# Patient Record
Sex: Male | Born: 1975 | Race: White | Hispanic: Yes | Marital: Married | Smoking: Never smoker
Health system: Southern US, Community
[De-identification: ages and names within clinical notes are randomized; demographics above are authoritative.]

## PROBLEM LIST (undated history)

## (undated) DIAGNOSIS — K219 Gastro-esophageal reflux disease without esophagitis: Secondary | ICD-10-CM

## (undated) DIAGNOSIS — I10 Essential (primary) hypertension: Secondary | ICD-10-CM

## (undated) DIAGNOSIS — G43909 Migraine, unspecified, not intractable, without status migrainosus: Secondary | ICD-10-CM

## (undated) HISTORY — PX: WISDOM TOOTH EXTRACTION: SHX21

## (undated) HISTORY — PX: OTHER SURGICAL HISTORY: SHX169

## (undated) HISTORY — PX: ROTATOR CUFF REPAIR: SHX139

## (undated) HISTORY — PX: APPENDECTOMY: SHX54

## (undated) HISTORY — PX: HERNIA REPAIR: SHX51

---

## 2005-02-07 ENCOUNTER — Emergency Department (HOSPITAL_COMMUNITY): Admission: EM | Admit: 2005-02-07 | Discharge: 2005-02-07 | Payer: Self-pay | Admitting: Emergency Medicine

## 2007-05-17 ENCOUNTER — Encounter: Admission: RE | Admit: 2007-05-17 | Discharge: 2007-05-17 | Payer: Self-pay | Admitting: Internal Medicine

## 2007-09-10 ENCOUNTER — Emergency Department (HOSPITAL_COMMUNITY): Admission: EM | Admit: 2007-09-10 | Discharge: 2007-09-10 | Payer: Self-pay | Admitting: Emergency Medicine

## 2009-01-11 ENCOUNTER — Emergency Department (HOSPITAL_COMMUNITY): Admission: EM | Admit: 2009-01-11 | Discharge: 2009-01-11 | Payer: Self-pay | Admitting: Emergency Medicine

## 2009-05-27 ENCOUNTER — Emergency Department (HOSPITAL_COMMUNITY): Admission: EM | Admit: 2009-05-27 | Discharge: 2009-05-27 | Payer: Self-pay | Admitting: Family Medicine

## 2009-08-24 ENCOUNTER — Emergency Department (HOSPITAL_COMMUNITY): Admission: EM | Admit: 2009-08-24 | Discharge: 2009-08-24 | Payer: Self-pay | Admitting: Emergency Medicine

## 2010-04-04 ENCOUNTER — Emergency Department (HOSPITAL_COMMUNITY): Admission: EM | Admit: 2010-04-04 | Discharge: 2010-04-04 | Payer: Self-pay | Admitting: Emergency Medicine

## 2010-10-12 ENCOUNTER — Ambulatory Visit (INDEPENDENT_AMBULATORY_CARE_PROVIDER_SITE_OTHER): Payer: 59

## 2010-10-12 ENCOUNTER — Inpatient Hospital Stay (INDEPENDENT_AMBULATORY_CARE_PROVIDER_SITE_OTHER)
Admission: RE | Admit: 2010-10-12 | Discharge: 2010-10-12 | Disposition: A | Discharge status: Home / Self Care | Payer: 59 | Source: Ambulatory Visit | Attending: Family Medicine | Admitting: Family Medicine

## 2010-10-12 DIAGNOSIS — M25519 Pain in unspecified shoulder: Secondary | ICD-10-CM

## 2010-11-14 LAB — DIFFERENTIAL
Basophils Absolute: 0 10*3/uL (ref 0.0–0.1)
Basophils Relative: 0 % (ref 0–1)
Eosinophils Absolute: 0.2 10*3/uL (ref 0.0–0.7)
Lymphs Abs: 2.1 10*3/uL (ref 0.7–4.0)
Monocytes Absolute: 1 10*3/uL (ref 0.1–1.0)
Monocytes Relative: 11 % (ref 3–12)

## 2010-11-14 LAB — CBC
HCT: 43.6 % (ref 39.0–52.0)
Hemoglobin: 15.6 g/dL (ref 13.0–17.0)
Platelets: 320 10*3/uL (ref 150–400)
RDW: 12 % (ref 11.5–15.5)
WBC: 9.8 10*3/uL (ref 4.0–10.5)

## 2010-11-14 LAB — HEMOCCULT GUIAC POC 1CARD (OFFICE): Fecal Occult Bld: POSITIVE

## 2012-08-01 ENCOUNTER — Emergency Department (HOSPITAL_COMMUNITY)
Admission: EM | Admit: 2012-08-01 | Discharge: 2012-08-01 | Disposition: A | Discharge status: Home / Self Care | Payer: Worker's Compensation | Attending: Emergency Medicine | Admitting: Emergency Medicine

## 2012-08-01 ENCOUNTER — Encounter (HOSPITAL_COMMUNITY): Payer: Self-pay | Admitting: Emergency Medicine

## 2012-08-01 ENCOUNTER — Emergency Department (HOSPITAL_COMMUNITY): Payer: Worker's Compensation

## 2012-08-01 DIAGNOSIS — M25519 Pain in unspecified shoulder: Secondary | ICD-10-CM

## 2012-08-01 DIAGNOSIS — S4980XA Other specified injuries of shoulder and upper arm, unspecified arm, initial encounter: Secondary | ICD-10-CM | POA: Insufficient documentation

## 2012-08-01 DIAGNOSIS — Y9389 Activity, other specified: Secondary | ICD-10-CM | POA: Insufficient documentation

## 2012-08-01 DIAGNOSIS — Y9289 Other specified places as the place of occurrence of the external cause: Secondary | ICD-10-CM | POA: Insufficient documentation

## 2012-08-01 DIAGNOSIS — S46909A Unspecified injury of unspecified muscle, fascia and tendon at shoulder and upper arm level, unspecified arm, initial encounter: Secondary | ICD-10-CM | POA: Insufficient documentation

## 2012-08-01 DIAGNOSIS — S4992XA Unspecified injury of left shoulder and upper arm, initial encounter: Secondary | ICD-10-CM

## 2012-08-01 DIAGNOSIS — I10 Essential (primary) hypertension: Secondary | ICD-10-CM | POA: Insufficient documentation

## 2012-08-01 DIAGNOSIS — W52XXXA Crushed, pushed or stepped on by crowd or human stampede, initial encounter: Secondary | ICD-10-CM | POA: Insufficient documentation

## 2012-08-01 DIAGNOSIS — Z79899 Other long term (current) drug therapy: Secondary | ICD-10-CM | POA: Insufficient documentation

## 2012-08-01 HISTORY — DX: Essential (primary) hypertension: I10

## 2012-08-01 MED ORDER — MELOXICAM 15 MG PO TABS: ORAL_TABLET | ORAL | Status: DC

## 2012-08-01 MED ORDER — MELOXICAM 7.5 MG PO TABS: 15.0000 mg | ORAL_TABLET | Freq: Every day | ORAL | Status: DC
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
## 2012-08-04 NOTE — ED Provider Notes (Signed)
Medical screening examination/treatment/procedure(s) were performed by non-physician practitioner and as supervising physician I was immediately available for consultation/collaboration.  Raeford Razor, MD 08/04/12 [phone]
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
## 2013-12-15 ENCOUNTER — Ambulatory Visit
Admission: RE | Admit: 2013-12-15 | Discharge: 2013-12-15 | Disposition: A | Discharge status: Home / Self Care | Payer: No Typology Code available for payment source | Source: Ambulatory Visit | Attending: Occupational Medicine | Admitting: Occupational Medicine

## 2013-12-15 ENCOUNTER — Other Ambulatory Visit: Payer: Self-pay | Admitting: Occupational Medicine

## 2013-12-15 DIAGNOSIS — Z0000 Encounter for general adult medical examination without abnormal findings: Secondary | ICD-10-CM

## 2014-03-20 ENCOUNTER — Ambulatory Visit: Payer: Self-pay | Admitting: Cardiovascular Disease

## 2014-07-03 ENCOUNTER — Emergency Department (HOSPITAL_COMMUNITY)
Admission: EM | Admit: 2014-07-03 | Discharge: 2014-07-03 | Disposition: A | Discharge status: Home / Self Care | Payer: 59 | Source: Home / Self Care | Attending: Emergency Medicine | Admitting: Emergency Medicine

## 2014-07-03 ENCOUNTER — Encounter (HOSPITAL_COMMUNITY): Payer: Self-pay | Admitting: Emergency Medicine

## 2014-07-03 DIAGNOSIS — J9801 Acute bronchospasm: Secondary | ICD-10-CM

## 2014-07-03 DIAGNOSIS — J069 Acute upper respiratory infection, unspecified: Secondary | ICD-10-CM

## 2014-07-03 MED ORDER — BENZONATATE 100 MG PO CAPS: 100.0000 mg | ORAL_CAPSULE | Freq: Three times a day (TID) | ORAL | Status: DC

## 2014-07-03 MED ORDER — ALBUTEROL SULFATE HFA 108 (90 BASE) MCG/ACT IN AERS
1.0000 | INHALATION_SPRAY | Freq: Four times a day (QID) | RESPIRATORY_TRACT | Status: DC | PRN
Start: 2014-07-03 — End: 2020-02-14

## 2014-07-03 MED ORDER — ALBUTEROL SULFATE (5 MG/ML) 0.5% IN NEBU
5.0000 mg | INHALATION_SOLUTION | Freq: Once | RESPIRATORY_TRACT | Status: AC
  Administered 2014-07-03: 5 mg via RESPIRATORY_TRACT

## 2014-07-03 MED ORDER — IPRATROPIUM BROMIDE 0.02 % IN SOLN
0.5000 mg | Freq: Once | RESPIRATORY_TRACT | Status: AC
  Administered 2014-07-03: 0.5 mg via RESPIRATORY_TRACT

## 2014-07-03 MED ORDER — PREDNISONE 20 MG PO TABS
ORAL_TABLET | ORAL | Status: AC
  Filled 2014-07-03: qty 3

## 2014-07-03 MED ORDER — IPRATROPIUM BROMIDE 0.02 % IN SOLN
RESPIRATORY_TRACT | Status: AC
  Filled 2014-07-03: qty 2.5

## 2014-07-03 MED ORDER — ALBUTEROL SULFATE (2.5 MG/3ML) 0.083% IN NEBU
INHALATION_SOLUTION | RESPIRATORY_TRACT | Status: AC
  Filled 2014-07-03: qty 3

## 2014-07-03 MED ORDER — IPRATROPIUM BROMIDE 0.06 % NA SOLN: 2.0000 | Freq: Four times a day (QID) | NASAL | Status: DC

## 2014-07-03 MED ORDER — PREDNISONE 20 MG PO TABS
60.0000 mg | ORAL_TABLET | Freq: Once | ORAL | Status: AC
  Administered 2014-07-03: 60 mg via ORAL

## 2014-07-03 NOTE — Discharge Instructions (Signed)
Bronchospasm °A bronchospasm is a spasm or tightening of the airways going into the lungs. During a bronchospasm breathing becomes more difficult because the airways get smaller. When this happens there can be coughing, a whistling sound when breathing (wheezing), and difficulty breathing. Bronchospasm is often associated with asthma, but not all patients who experience a bronchospasm have asthma. °CAUSES  °A bronchospasm is caused by inflammation or irritation of the airways. The inflammation or irritation may be triggered by:  °· Allergies (such as to animals, pollen, food, or mold). Allergens that cause bronchospasm may cause wheezing immediately after exposure or many hours later.   °· Infection. Viral infections are believed to be the most common cause of bronchospasm.   °· Exercise.   °· Irritants (such as pollution, cigarette smoke, strong odors, aerosol sprays, and paint fumes).   °· Weather changes. Winds increase molds and pollens in the air. Rain refreshes the air by washing irritants out. Cold air may cause inflammation.   °· Stress and emotional upset.   °SIGNS AND SYMPTOMS  °· Wheezing.   °· Excessive nighttime coughing.   °· Frequent or severe coughing with a simple cold.   °· Chest tightness.   °· Shortness of breath.   °DIAGNOSIS  °Bronchospasm is usually diagnosed through a history and physical exam. Tests, such as chest X-rays, are sometimes done to look for other conditions. °TREATMENT  °· Inhaled medicines can be given to open up your airways and help you breathe. The medicines can be given using either an inhaler or a nebulizer machine. °· Corticosteroid medicines may be given for severe bronchospasm, usually when it is associated with asthma. °HOME CARE INSTRUCTIONS  °· Always have a plan prepared for seeking medical care. Know when to call your health care provider and local emergency services (911 in the [place] Know where you can access local emergency care. °· Only take medicines as  directed by your health care provider. °· If you were prescribed an inhaler or nebulizer machine, ask your health care provider to explain how to use it correctly. Always use a spacer with your inhaler if you were given one. °· It is necessary to remain calm during an attack. Try to relax and breathe more slowly.  °· Control your home environment in the following ways:   °· Change your heating and air conditioning filter at least once a month.   °· Limit your use of fireplaces and wood stoves. °· Do not smoke and do not allow smoking in your home.   °· Avoid exposure to perfumes and fragrances.   °· Get rid of pests (such as roaches and mice) and their droppings.   °· Throw away plants if you see mold on them.   °· Keep your house clean and dust free.   °· Replace carpet with wood, tile, or vinyl flooring. Carpet can trap dander and dust.   °· Use allergy-proof pillows, mattress covers, and box spring covers.   °· Wash bed sheets and blankets every week in hot water and dry them in a dryer.   °· Use blankets that are made of polyester or cotton.   °· Wash hands frequently. °SEEK MEDICAL CARE IF:  °· You have muscle aches.   °· You have chest pain.   °· The sputum changes from clear or white to yellow, green, gray, or bloody.   °· The sputum you cough up gets thicker.   °· There are problems that may be related to the medicine you are given, such as a rash, itching, swelling, or trouble breathing.   °SEEK IMMEDIATE MEDICAL CARE IF:  °· You have worsening wheezing and coughing even   after taking your prescribed medicines.   °· You have increased difficulty breathing.   °· You develop severe chest pain. °MAKE SURE YOU:  °· Understand these instructions. °· Will watch your condition. °· Will get help right away if you are not doing well or get worse. °Document Released: 07/31/2003 Document Revised: 08/02/2013 Document Reviewed: 01/17/2013 °ExitCare® Patient Information ©2015 [employer]. This information is not  intended to replace advice given to you by your health care provider. Make sure you discuss any questions you have with your health care provider. ° °Upper Respiratory Infection, Adult °An upper respiratory infection (URI) is also sometimes known as the common cold. The upper respiratory tract includes the nose, sinuses, throat, trachea, and bronchi. Bronchi are the airways leading to the lungs. Most people improve within 1 week, but symptoms can last up to 2 weeks. A residual cough may last even longer.  °CAUSES °Many different viruses can infect the tissues lining the upper respiratory tract. The tissues become irritated and inflamed and often become very moist. Mucus production is also common. A cold is contagious. You can easily spread the virus to others by oral contact. This includes kissing, sharing a glass, coughing, or sneezing. Touching your mouth or nose and then touching a surface, which is then touched by another person, can also spread the virus. °SYMPTOMS  °Symptoms typically develop 1 to 3 days after you come in contact with a cold virus. Symptoms vary from person to person. They may include: °· Runny nose. °· Sneezing. °· Nasal congestion. °· Sinus irritation. °· Sore throat. °· Loss of voice (laryngitis). °· Cough. °· Fatigue. °· Muscle aches. °· Loss of appetite. °· Headache. °· Low-grade fever. °DIAGNOSIS  °You might diagnose your own cold based on familiar symptoms, since most people get a cold 2 to 3 times a year. Your caregiver can confirm this based on your exam. Most importantly, your caregiver can check that your symptoms are not due to another disease such as strep throat, sinusitis, pneumonia, asthma, or epiglottitis. Blood tests, throat tests, and X-rays are not necessary to diagnose a common cold, but they may sometimes be helpful in excluding other more serious diseases. Your caregiver will decide if any further tests are required. °RISKS AND COMPLICATIONS  °You may be at risk for a  more severe case of the common cold if you smoke cigarettes, have chronic heart disease (such as heart failure) or lung disease (such as asthma), or if you have a weakened immune system. The very young and very old are also at risk for more serious infections. Bacterial sinusitis, middle ear infections, and bacterial pneumonia can complicate the common cold. The common cold can worsen asthma and chronic obstructive pulmonary disease (COPD). Sometimes, these complications can require emergency medical care and may be life-threatening. °PREVENTION  °The best way to protect against getting a cold is to practice good hygiene. Avoid oral or hand contact with people with cold symptoms. Wash your hands often if contact occurs. There is no clear evidence that vitamin C, vitamin E, echinacea, or exercise reduces the chance of developing a cold. However, it is always recommended to get plenty of rest and practice good nutrition. °TREATMENT  °Treatment is directed at relieving symptoms. There is no cure. Antibiotics are not effective, because the infection is caused by a virus, not by bacteria. Treatment may include: °· Increased fluid intake. Sports drinks offer valuable electrolytes, sugars, and fluids. °· Breathing heated mist or steam (vaporizer or shower). °· Eating chicken soup   or other clear broths, and maintaining good nutrition. °· Getting plenty of rest. °· Using gargles or lozenges for comfort. °· Controlling fevers with ibuprofen or acetaminophen as directed by your caregiver. °· Increasing usage of your inhaler if you have asthma. °Zinc gel and zinc lozenges, taken in the first 24 hours of the common cold, can shorten the duration and lessen the severity of symptoms. Pain medicines may help with fever, muscle aches, and throat pain. A variety of non-prescription medicines are available to treat congestion and runny nose. Your caregiver can make recommendations and may suggest nasal or lung inhalers for other  symptoms.  °HOME CARE INSTRUCTIONS  °· Only take over-the-counter or prescription medicines for pain, discomfort, or fever as directed by your caregiver. °· Use a warm mist humidifier or inhale steam from a shower to increase air moisture. This may keep secretions moist and make it easier to breathe. °· Drink enough water and fluids to keep your urine clear or pale yellow. °· Rest as needed. °· Return to work when your temperature has returned to normal or as your caregiver advises. You may need to stay home longer to avoid infecting others. You can also use a face mask and careful hand washing to prevent spread of the virus. °SEEK MEDICAL CARE IF:  °· After the first few days, you feel you are getting worse rather than better. °· You need your caregiver's advice about medicines to control symptoms. °· You develop chills, worsening shortness of breath, or brown or red sputum. These may be signs of pneumonia. °· You develop yellow or brown nasal discharge or pain in the face, especially when you bend forward. These may be signs of sinusitis. °· You develop a fever, swollen neck glands, pain with swallowing, or white areas in the back of your throat. These may be signs of strep throat. °SEEK IMMEDIATE MEDICAL CARE IF:  °· You have a fever. °· You develop severe or persistent headache, ear pain, sinus pain, or chest pain. °· You develop wheezing, a prolonged cough, cough up blood, or have a change in your usual mucus (if you have chronic lung disease). °· You develop sore muscles or a stiff neck. °Document Released: 01/21/2001 Document Revised: 10/20/2011 Document Reviewed: 11/02/2013 °ExitCare® Patient Information ©2015 [employer]. This information is not intended to replace advice given to you by your health care provider. Make sure you discuss any questions you have with your health care provider. ° °

## 2014-07-03 NOTE — ED Provider Notes (Signed)
CSN: [id number]     Arrival date & time 07/03/14  0808 History   First MD Initiated Contact with Patient 07/03/14 [phone]     Chief Complaint  Patient presents with  . Cough  . Wheezing  . Shortness of Breath   (Consider location/radiation/quality/duration/timing/severity/associated sxs/prior Treatment) HPI Comments: Non-smoker Works as Emergency planning/management officerpolice officer  Patient is a 38 y.o. male presenting with URI.  URI Presenting symptoms: congestion, cough and rhinorrhea   Severity:  Moderate Onset quality:  Gradual Duration:  24 hours Timing:  Constant Progression:  Worsening Chronicity:  New Associated symptoms: wheezing   Associated symptoms: no arthralgias, no headaches, no myalgias, no neck pain, no sinus pain, no sneezing and no swollen glands     Past Medical History  Diagnosis Date  . Hypertension    Past Surgical History  Procedure Laterality Date  . Rotator cuff repair      Left  . Appendectomy    . Wisdom tooth extraction    . Dental implant     History reviewed. No pertinent family history. History  Substance Use Topics  . Smoking status: Never Smoker   . Smokeless tobacco: Not on file  . Alcohol Use: No    Review of Systems  HENT: Positive for congestion and rhinorrhea. Negative for sneezing.   Respiratory: Positive for cough and wheezing.   Musculoskeletal: Negative for myalgias, arthralgias and neck pain.  Neurological: Negative for headaches.  All other systems reviewed and are negative.   Allergies  Review of patient's allergies indicates no known allergies.  Home Medications   Prior to Admission medications   Medication Sig Start Date End Date Taking? Authorizing Provider  albuterol (PROVENTIL HFA;VENTOLIN HFA) 108 (90 BASE) MCG/ACT inhaler Inhale 1-2 puffs into the lungs every 6 (six) hours as needed for wheezing or shortness of breath. 07/03/14   Mathis FareJennifer Lee H Laquinton Bihm, PA  benzonatate (TESSALON) 100 MG capsule Take 1 capsule (100 mg total) by mouth  every 8 (eight) hours. 07/03/14   Mathis FareJennifer Lee H Janicia Monterrosa, PA  ipratropium (ATROVENT) 0.06 % nasal spray Place 2 sprays into both nostrils 4 (four) times daily. For nasal congestion 07/03/14   Ria ClockJennifer Lee H Elener Custodio, PA  meloxicam (MOBIC) 15 MG tablet Take one by mouth daily with a full meal. 08/01/12   Arthor CaptainAbigail Harris, PA-C  niacin (NIASPAN) 500 MG CR tablet Take 500 mg by mouth at bedtime.    Historical Provider, MD  verapamil (CALAN-SR) 120 MG CR tablet Take 120 mg by mouth at bedtime.    Historical Provider, MD   BP 125/98 mmHg  Pulse 80  Temp(Src) 98.7 F (37.1 C) (Oral)  Resp 16  SpO2 99% Physical Exam  Constitutional: He is oriented to person, place, and time. He appears well-developed and well-nourished. No distress.  HENT:  Head: Normocephalic and atraumatic.  Right Ear: Hearing, tympanic membrane, external ear and ear canal normal.  Left Ear: Hearing, external ear and ear canal normal. Tympanic membrane is injected.  Nose: Nose normal.  Mouth/Throat: Uvula is midline, oropharynx is clear and moist and mucous membranes are normal.  Eyes: Conjunctivae are normal. No scleral icterus.  Neck: Normal range of motion. Neck supple.  Cardiovascular: Normal rate, regular rhythm and normal heart sounds.   Pulmonary/Chest: Effort normal. No respiratory distress. He has wheezes. He has no rales. He exhibits no tenderness.  Musculoskeletal: Normal range of motion.  Lymphadenopathy:    He has no cervical adenopathy.  Neurological: He is alert and oriented to person,  place, and time.  Skin: Skin is warm and dry.  Psychiatric: He has a normal mood and affect. His behavior is normal.  Nursing note and vitals reviewed.   ED Course  Procedures (including critical care time) Labs Review Labs Reviewed - No data to display  Imaging Review No results found.   MDM   1. URI (upper respiratory infection)   2. Bronchospasm     Patient given Albuterol/Atrovent 5mg /0.5mg  neb treatment and  [facility]  [facility] reports significant improvement following neb tx Will D/C home on Atrovent nasal spray, tessalon, albuterol MDI and advise follow up if no improvement.   Mathis FareJennifer Lee H CombinePresson, [place] 07/03/14 [phone]

## 2014-07-03 NOTE — ED Notes (Signed)
Pt states that he has had shortness of breath with productive cough and some wheezing since last night it started at work. Pt is in no acute distress at this time

## 2017-01-20 ENCOUNTER — Other Ambulatory Visit: Payer: Self-pay | Admitting: Orthopedic Surgery

## 2017-01-20 DIAGNOSIS — R52 Pain, unspecified: Secondary | ICD-10-CM

## 2017-02-05 ENCOUNTER — Ambulatory Visit
Admission: RE | Admit: 2017-02-05 | Discharge: 2017-02-05 | Disposition: A | Discharge status: Home / Self Care | Payer: 59 | Source: Ambulatory Visit | Attending: Orthopedic Surgery | Admitting: Orthopedic Surgery

## 2017-02-05 DIAGNOSIS — R52 Pain, unspecified: Secondary | ICD-10-CM

## 2018-08-05 ENCOUNTER — Emergency Department (HOSPITAL_COMMUNITY): Payer: 59

## 2018-08-05 ENCOUNTER — Other Ambulatory Visit: Payer: Self-pay

## 2018-08-05 ENCOUNTER — Emergency Department (HOSPITAL_COMMUNITY)
Admission: EM | Admit: 2018-08-05 | Discharge: 2018-08-05 | Disposition: A | Discharge status: Home / Self Care | Payer: 59 | Attending: Emergency Medicine | Admitting: Emergency Medicine

## 2018-08-05 DIAGNOSIS — S46311A Strain of muscle, fascia and tendon of triceps, right arm, initial encounter: Secondary | ICD-10-CM | POA: Insufficient documentation

## 2018-08-05 DIAGNOSIS — Y999 Unspecified external cause status: Secondary | ICD-10-CM | POA: Diagnosis not present

## 2018-08-05 DIAGNOSIS — Z79899 Other long term (current) drug therapy: Secondary | ICD-10-CM | POA: Insufficient documentation

## 2018-08-05 DIAGNOSIS — I10 Essential (primary) hypertension: Secondary | ICD-10-CM | POA: Diagnosis not present

## 2018-08-05 DIAGNOSIS — S40021A Contusion of right upper arm, initial encounter: Secondary | ICD-10-CM | POA: Diagnosis not present

## 2018-08-05 DIAGNOSIS — Y9389 Activity, other specified: Secondary | ICD-10-CM | POA: Diagnosis not present

## 2018-08-05 DIAGNOSIS — M25521 Pain in right elbow: Secondary | ICD-10-CM | POA: Diagnosis not present

## 2018-08-05 DIAGNOSIS — Z23 Encounter for immunization: Secondary | ICD-10-CM | POA: Insufficient documentation

## 2018-08-05 DIAGNOSIS — W19XXXA Unspecified fall, initial encounter: Secondary | ICD-10-CM

## 2018-08-05 DIAGNOSIS — S4991XA Unspecified injury of right shoulder and upper arm, initial encounter: Secondary | ICD-10-CM | POA: Diagnosis present

## 2018-08-05 DIAGNOSIS — Y929 Unspecified place or not applicable: Secondary | ICD-10-CM | POA: Insufficient documentation

## 2018-08-05 DIAGNOSIS — S46319A Strain of muscle, fascia and tendon of triceps, unspecified arm, initial encounter: Secondary | ICD-10-CM

## 2018-08-05 MED ORDER — BACITRACIN ZINC 500 UNIT/GM EX OINT
TOPICAL_OINTMENT | Freq: Once | CUTANEOUS | Status: DC
  Filled 2018-08-05: qty 0.9

## 2018-08-05 MED ORDER — TETANUS-DIPHTH-ACELL PERTUSSIS 5-2.5-18.5 LF-MCG/0.5 IM SUSP
0.5000 mL | Freq: Once | INTRAMUSCULAR | Status: AC
  Administered 2018-08-05: 0.5 mL via INTRAMUSCULAR
  Filled 2018-08-05: qty 0.5

## 2018-08-05 MED ORDER — IBUPROFEN 200 MG PO TABS
600.0000 mg | ORAL_TABLET | Freq: Once | ORAL | Status: AC
  Administered 2018-08-05: 600 mg via ORAL
  Filled 2018-08-05: qty 3

## 2018-08-05 MED ORDER — NAPROXEN 500 MG PO TABS: 500.0000 mg | ORAL_TABLET | Freq: Two times a day (BID) | ORAL | 0 refills | Status: DC

## 2018-08-05 NOTE — ED Provider Notes (Signed)
[facility]-EMERGENCY DEPT Provider Note   CSN: [id number] Arrival date & time: 08/05/18  1449     History   Chief Complaint Chief Complaint  Patient presents with  . Shoulder Pain    HPI [name] is a 42 y.o. male who presents to the ED with right elbow and upper arm pain. Patient reports he was helping his daughter learn to ride a scooter and they both fell. He tried to cushion her fall and he hit the ground hard on his right arm. Patient denies head or neck injury or LOC.   HPI  Past Medical History:  Diagnosis Date  . Hypertension     There are no active problems to display for this patient.   Past Surgical History:  Procedure Laterality Date  . APPENDECTOMY    . dental implant    . ROTATOR CUFF REPAIR     Left  . WISDOM TOOTH EXTRACTION          Home Medications    Prior to Admission medications   Medication Sig Start Date End Date Taking? Authorizing Provider  albuterol (PROVENTIL HFA;VENTOLIN HFA) 108 (90 BASE) MCG/ACT inhaler Inhale 1-2 puffs into the lungs every 6 (six) hours as needed for wheezing or shortness of breath. 07/03/14   Presson, Mathis FareJennifer Lee H, PA  benzonatate (TESSALON) 100 MG capsule Take 1 capsule (100 mg total) by mouth every 8 (eight) hours. 07/03/14   Presson, Jess BartersJennifer Lee H, PA  ipratropium (ATROVENT) 0.06 % nasal spray Place 2 sprays into both nostrils 4 (four) times daily. For nasal congestion 07/03/14   Presson, Mathis FareJennifer Lee H, PA  meloxicam (MOBIC) 15 MG tablet Take one by mouth daily with a full meal. 08/01/12   Arthor CaptainHarris, Abigail, PA-C  naproxen (NAPROSYN) 500 MG tablet Take 1 tablet (500 mg total) by mouth 2 (two) times daily. 08/05/18   Janne NapoleonNeese, Tyrus Wilms M, NP  niacin (NIASPAN) 500 MG CR tablet Take 500 mg by mouth at bedtime.    [provider]  verapamil (CALAN-SR) 120 MG CR tablet Take 120 mg by mouth at bedtime.    [provider]    Family History No family history on  file.  Social History Social History   Tobacco Use  . Smoking status: Never Smoker  Substance Use Topics  . Alcohol use: No  . Drug use: No     Allergies   Patient has no known allergies.   Review of Systems Review of Systems  Musculoskeletal: Positive for arthralgias.  Skin: Positive for wound.  All other systems reviewed and are negative.    Physical Exam Updated Vital Signs BP (!) 148/108   Pulse 79   Temp 98.7 F (37.1 C) (Oral)   Resp 16   Ht 5' 9.5" (1.765 m)   Wt 103.4 kg   SpO2 98%   BMI 33.19 kg/m   Physical Exam Vitals signs and nursing note reviewed.  Constitutional:      General: He is not in acute distress.    Appearance: He is well-developed.  HENT:     Head: Normocephalic.     Nose: Nose normal.     Mouth/Throat:     Mouth: Mucous membranes are moist.  Eyes:     Extraocular Movements: Extraocular movements intact.     Conjunctiva/sclera: Conjunctivae normal.  Neck:     Musculoskeletal: Normal range of motion and neck supple. No muscular tenderness.  Cardiovascular:     Rate and Rhythm: Normal rate.  Pulmonary:     Effort: Pulmonary effort is normal.  Abdominal:     Palpations: Abdomen is soft.     Tenderness: There is no abdominal tenderness.  Musculoskeletal:     Right elbow: He exhibits swelling. Tenderness found. Olecranon process tenderness noted.     Right upper arm: He exhibits tenderness and swelling.     Comments: Radial pulse 2+, adequate circulation. Tender with palpation and range of motion of the elbow and triceps. Patient able to straighten his arms and has equal strength.  Patient denies shoulder pain and has full rang of motion without pain.   Skin:    General: Skin is warm and dry.  Neurological:     Mental Status: He is alert and oriented to person, place, and time.  Psychiatric:        Mood and Affect: Mood normal.      ED Treatments / Results  Labs (all labs ordered are listed, but only abnormal results are  displayed) Labs Reviewed - No data to display  Radiology Dg Shoulder Right  Result Date: 08/05/2018 CLINICAL DATA:  Larey Seat today.  Right shoulder pain. EXAM: RIGHT SHOULDER - 2+ VIEW COMPARISON:  None. FINDINGS: The joint spaces are maintained. No acute fracture is identified. The visualized right ribs are intact and the visualized right lung is clear. IMPRESSION: No fracture or dislocation. Electronically Signed   By: Rudie Meyer M.D.   On: 08/05/2018 15:38   Dg Elbow Complete Right  Result Date: 08/05/2018 CLINICAL DATA:  Larey Seat and injured right elbow today. EXAM: RIGHT ELBOW - COMPLETE 3+ VIEW COMPARISON:  None. FINDINGS: The joint spaces are maintained. No acute fracture. No degenerative changes. No osteochondral lesion. No joint effusion. IMPRESSION: No acute fracture or joint effusion. Electronically Signed   By: Rudie Meyer M.D.   On: 08/05/2018 15:36   Dg Humerus Right  Result Date: 08/05/2018 CLINICAL DATA:  Larey Seat today.  Right arm pain. EXAM: RIGHT HUMERUS - 2+ VIEW COMPARISON:  None. FINDINGS: The shoulder and elbow joints are maintained. No acute fracture of the humerus is identified. IMPRESSION: No acute bony findings. Electronically Signed   By: Rudie Meyer M.D.   On: 08/05/2018 15:36    Procedures Procedures (including critical care time)  Medications Ordered in ED Medications  bacitracin ointment (has no administration in time range)  Tdap (BOOSTRIX) injection 0.5 mL (has no administration in time range)  ibuprofen (ADVIL,MOTRIN) tablet 600 mg (600 mg Oral Given 08/05/18 1639)     Initial Impression / Assessment and Plan / ED Course  I have reviewed the triage vital signs and the nursing notes. 42 y.o. male here with right arm pain s/p fall from a scooter stable for d/c without fracture or dislocation note on x-ray and no focal neuro deficits. Patient reports he has a sling at home from his rotator cuff surgery that he can use as needed. If his pain in the right  elbow and triceps area continues he will f/u with his orthopedic surgeon. Return precautions discussed.   Final Clinical Impressions(s) / ED Diagnoses   Final diagnoses:  Arm contusion, right, initial encounter  Triceps strain, initial encounter  Fall, initial encounter    ED Discharge Orders         Ordered    naproxen (NAPROSYN) 500 MG tablet  2 times daily     08/05/18 1732           Kerrie Buffalo [place], NP 08/05/18 1737    [place],  Raynelle FanningJulie, MD 08/05/18 2242

## 2018-08-05 NOTE — Discharge Instructions (Signed)
Wear the sling you have at home as needed and take the medication for pain and inflammation. If the pain in your arm continues follow up with your orthopedic doctor for further evaluation. Return here as needed.

## 2018-08-05 NOTE — ED Triage Notes (Signed)
Pt reports falling off daughters scooter 2p today.  Reports sharp right triceps and elbow area.

## 2018-11-25 ENCOUNTER — Observation Stay (HOSPITAL_COMMUNITY)
Admission: EM | Admit: 2018-11-25 | Discharge: 2018-11-26 | Disposition: A | Discharge status: Home / Self Care | Payer: 59 | Attending: Internal Medicine | Admitting: Internal Medicine

## 2018-11-25 ENCOUNTER — Encounter (HOSPITAL_COMMUNITY): Payer: Self-pay

## 2018-11-25 ENCOUNTER — Emergency Department (HOSPITAL_COMMUNITY): Payer: 59

## 2018-11-25 ENCOUNTER — Other Ambulatory Visit: Payer: Self-pay

## 2018-11-25 DIAGNOSIS — K219 Gastro-esophageal reflux disease without esophagitis: Secondary | ICD-10-CM | POA: Insufficient documentation

## 2018-11-25 DIAGNOSIS — D72825 Bandemia: Secondary | ICD-10-CM | POA: Diagnosis not present

## 2018-11-25 DIAGNOSIS — J9601 Acute respiratory failure with hypoxia: Secondary | ICD-10-CM | POA: Insufficient documentation

## 2018-11-25 DIAGNOSIS — G43909 Migraine, unspecified, not intractable, without status migrainosus: Secondary | ICD-10-CM | POA: Insufficient documentation

## 2018-11-25 DIAGNOSIS — Z791 Long term (current) use of non-steroidal anti-inflammatories (NSAID): Secondary | ICD-10-CM | POA: Insufficient documentation

## 2018-11-25 DIAGNOSIS — I10 Essential (primary) hypertension: Secondary | ICD-10-CM

## 2018-11-25 DIAGNOSIS — D72829 Elevated white blood cell count, unspecified: Secondary | ICD-10-CM | POA: Diagnosis not present

## 2018-11-25 DIAGNOSIS — R6889 Other general symptoms and signs: Secondary | ICD-10-CM | POA: Diagnosis present

## 2018-11-25 DIAGNOSIS — R0902 Hypoxemia: Secondary | ICD-10-CM | POA: Diagnosis present

## 2018-11-25 DIAGNOSIS — Z20828 Contact with and (suspected) exposure to other viral communicable diseases: Secondary | ICD-10-CM | POA: Diagnosis not present

## 2018-11-25 DIAGNOSIS — J9600 Acute respiratory failure, unspecified whether with hypoxia or hypercapnia: Secondary | ICD-10-CM | POA: Diagnosis present

## 2018-11-25 DIAGNOSIS — Z79899 Other long term (current) drug therapy: Secondary | ICD-10-CM | POA: Insufficient documentation

## 2018-11-25 DIAGNOSIS — Z20822 Contact with and (suspected) exposure to covid-19: Secondary | ICD-10-CM

## 2018-11-25 DIAGNOSIS — R05 Cough: Principal | ICD-10-CM | POA: Insufficient documentation

## 2018-11-25 DIAGNOSIS — R059 Cough, unspecified: Secondary | ICD-10-CM

## 2018-11-25 LAB — CBC WITH DIFFERENTIAL/PLATELET
Abs Immature Granulocytes: 0.06 10*3/uL (ref 0.00–0.07)
Basophils Absolute: 0.1 10*3/uL (ref 0.0–0.1)
Basophils Relative: 1 %
Eosinophils Absolute: 0.1 10*3/uL (ref 0.0–0.5)
Eosinophils Relative: 1 %
HCT: 47.5 % (ref 39.0–52.0)
Hemoglobin: 16.4 g/dL (ref 13.0–17.0)
Immature Granulocytes: 1 %
Lymphocytes Relative: 8 %
Lymphs Abs: 0.9 10*3/uL (ref 0.7–4.0)
MCH: 31.8 pg (ref 26.0–34.0)
MCHC: 34.5 g/dL (ref 30.0–36.0)
MCV: 92.1 fL (ref 80.0–100.0)
Monocytes Absolute: 0.7 10*3/uL (ref 0.1–1.0)
Monocytes Relative: 7 %
Neutro Abs: 9.2 10*3/uL — ABNORMAL HIGH (ref 1.7–7.7)
Neutrophils Relative %: 82 %
Platelets: 259 10*3/uL (ref 150–400)
RBC: 5.16 MIL/uL (ref 4.22–5.81)
RDW: 12.3 % (ref 11.5–15.5)
WBC: 11.1 10*3/uL — ABNORMAL HIGH (ref 4.0–10.5)
nRBC: 0 % (ref 0.0–0.2)

## 2018-11-25 LAB — BASIC METABOLIC PANEL
Anion gap: 10 (ref 5–15)
BUN: 14 mg/dL (ref 6–20)
CO2: 25 mmol/L (ref 22–32)
Calcium: 9.1 mg/dL (ref 8.9–10.3)
Chloride: 103 mmol/L (ref 98–111)
Creatinine, Ser: 1.17 mg/dL (ref 0.61–1.24)
GFR calc Af Amer: >60 mL/min (ref >60)
GFR calc non Af Amer: >60 mL/min (ref >60)
Glucose, Bld: 105 mg/dL — ABNORMAL HIGH (ref 70–99)
Potassium: 3.9 mmol/L (ref 3.5–5.1)
Sodium: 138 mmol/L (ref 135–145)

## 2018-11-25 LAB — LACTIC ACID, PLASMA: Lactic Acid, Venous: 1.3 mmol/L (ref 0.5–1.9)

## 2018-11-25 LAB — SARS CORONAVIRUS 2 BY RT PCR (HOSPITAL ORDER, PERFORMED IN ~~LOC~~ HOSPITAL LAB): SARS Coronavirus 2: NEGATIVE

## 2018-11-25 LAB — TROPONIN I: Troponin I: <0.03 ng/mL (ref <0.03)

## 2018-11-25 MED ORDER — ALBUTEROL SULFATE HFA 108 (90 BASE) MCG/ACT IN AERS
1.0000 | INHALATION_SPRAY | Freq: Four times a day (QID) | RESPIRATORY_TRACT | Status: DC
  Administered 2018-11-25: 1 via RESPIRATORY_TRACT
  Administered 2018-11-25: 2 via RESPIRATORY_TRACT
  Filled 2018-11-25: qty 6.7

## 2018-11-25 MED ORDER — ACETAMINOPHEN 650 MG RE SUPP: 650.0000 mg | Freq: Four times a day (QID) | RECTAL | Status: DC | PRN

## 2018-11-25 MED ORDER — PANTOPRAZOLE SODIUM 40 MG PO TBEC
40.0000 mg | DELAYED_RELEASE_TABLET | Freq: Every day | ORAL | Status: DC
  Administered 2018-11-25 – 2018-11-26 (×2): 40 mg via ORAL
  Filled 2018-11-25 (×2): qty 1

## 2018-11-25 MED ORDER — ALBUTEROL SULFATE HFA 108 (90 BASE) MCG/ACT IN AERS: 2.0000 | INHALATION_SPRAY | Freq: Four times a day (QID) | RESPIRATORY_TRACT | Status: DC | PRN

## 2018-11-25 MED ORDER — SODIUM CHLORIDE 0.9% FLUSH: 3.0000 mL | INTRAVENOUS | Status: DC | PRN

## 2018-11-25 MED ORDER — METOPROLOL TARTRATE 25 MG PO TABS
25.0000 mg | ORAL_TABLET | Freq: Two times a day (BID) | ORAL | Status: DC
  Administered 2018-11-25 – 2018-11-26 (×3): 25 mg via ORAL
  Filled 2018-11-25 (×3): qty 1

## 2018-11-25 MED ORDER — SODIUM CHLORIDE 0.9 % IV BOLUS
1000.0000 mL | Freq: Once | INTRAVENOUS | Status: AC
  Administered 2018-11-25: 1000 mL via INTRAVENOUS

## 2018-11-25 MED ORDER — ACETAMINOPHEN 325 MG PO TABS
650.0000 mg | ORAL_TABLET | Freq: Four times a day (QID) | ORAL | Status: DC | PRN
  Administered 2018-11-25 – 2018-11-26 (×3): 650 mg via ORAL
  Filled 2018-11-25 (×2): qty 2

## 2018-11-25 MED ORDER — SODIUM CHLORIDE 0.9% FLUSH
3.0000 mL | Freq: Two times a day (BID) | INTRAVENOUS | Status: DC
  Administered 2018-11-25 (×2): 3 mL via INTRAVENOUS

## 2018-11-25 MED ORDER — MENTHOL 3 MG MT LOZG
1.0000 | LOZENGE | OROMUCOSAL | Status: DC | PRN
  Filled 2018-11-25: qty 9

## 2018-11-25 MED ORDER — HYDROCOD POLST-CPM POLST ER 10-8 MG/5ML PO SUER
5.0000 mL | Freq: Two times a day (BID) | ORAL | Status: DC | PRN
  Administered 2018-11-25: 5 mL via ORAL
  Filled 2018-11-25: qty 5

## 2018-11-25 MED ORDER — SODIUM CHLORIDE 0.9 % IV SOLN: 250.0000 mL | INTRAVENOUS | Status: DC | PRN

## 2018-11-25 MED ORDER — VERAPAMIL HCL ER 120 MG PO TBCR
120.0000 mg | EXTENDED_RELEASE_TABLET | Freq: Every day | ORAL | Status: DC
  Administered 2018-11-25: 22:00:00 120 mg via ORAL
  Filled 2018-11-25: qty 1

## 2018-11-25 MED ORDER — ALBUTEROL SULFATE HFA 108 (90 BASE) MCG/ACT IN AERS
2.0000 | INHALATION_SPRAY | Freq: Two times a day (BID) | RESPIRATORY_TRACT | Status: DC
  Administered 2018-11-26: 2 via RESPIRATORY_TRACT

## 2018-11-25 MED ORDER — IBUPROFEN 200 MG PO TABS: 400.0000 mg | ORAL_TABLET | ORAL | Status: DC | PRN

## 2018-11-25 MED ORDER — SODIUM CHLORIDE 0.45 % IV SOLN
INTRAVENOUS | Status: DC
  Administered 2018-11-25 – 2018-11-26 (×2): via INTRAVENOUS

## 2018-11-25 MED ORDER — ONDANSETRON HCL 4 MG PO TABS: 4.0000 mg | ORAL_TABLET | Freq: Four times a day (QID) | ORAL | Status: DC | PRN

## 2018-11-25 MED ORDER — ALBUTEROL SULFATE HFA 108 (90 BASE) MCG/ACT IN AERS
1.0000 | INHALATION_SPRAY | Freq: Once | RESPIRATORY_TRACT | Status: AC
  Administered 2018-11-25: 1 via RESPIRATORY_TRACT
  Filled 2018-11-25: qty 6.7

## 2018-11-25 MED ORDER — ENOXAPARIN SODIUM 40 MG/0.4ML ~~LOC~~ SOLN
40.0000 mg | SUBCUTANEOUS | Status: DC
  Filled 2018-11-25: qty 0.4

## 2018-11-25 MED ORDER — DM-GUAIFENESIN ER 30-600 MG PO TB12
1.0000 | ORAL_TABLET | Freq: Two times a day (BID) | ORAL | Status: DC
  Administered 2018-11-25 – 2018-11-26 (×3): 1 via ORAL
  Filled 2018-11-25 (×3): qty 1

## 2018-11-25 MED ORDER — ONDANSETRON HCL 4 MG/2ML IJ SOLN: 4.0000 mg | Freq: Four times a day (QID) | INTRAMUSCULAR | Status: DC | PRN

## 2018-11-25 MED ORDER — ALBUTEROL SULFATE (2.5 MG/3ML) 0.083% IN NEBU: 2.5000 mg | INHALATION_SOLUTION | Freq: Four times a day (QID) | RESPIRATORY_TRACT | Status: DC

## 2018-11-25 MED ORDER — ACETAMINOPHEN 325 MG PO TABS
650.0000 mg | ORAL_TABLET | Freq: Once | ORAL | Status: AC
  Administered 2018-11-25: 650 mg via ORAL
  Filled 2018-11-25: qty 2

## 2018-11-25 NOTE — Progress Notes (Signed)
Pt refused Lovenox injection. Educated on the importance of VTE prophylaxis. Verbalized understanding. Dr. Butler Denmark made aware.

## 2018-11-25 NOTE — ED Triage Notes (Addendum)
Pt states that he woke up this morning feeling feverish, chills, and cough. Pt states when he coughs, he feels as though his lungs hurt. Last tylenol approximately 2 hours ago. 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

## 2018-11-25 NOTE — ED Notes (Addendum)
ED TO INPATIENT HANDOFF REPORT  ED Nurse Name and Phone #: Nelly LaurenceAshley   S Name/Age/Gender Stephen Nguyen 43 y.o. male Room/Bed: WA09/WA09  Code Status   Code Status: Not on file  Home/SNF/Other Home Patient oriented to: self, place, time and situation Is this baseline? Yes   Triage Complete: Triage complete  Chief Complaint cough;body aches;shob;fever  Triage Note Pt states that he woke up this morning feeling feverish, chills, and cough. Pt states when he coughs, he feels as though his lungs hurt. Last tylenol approximately 2 hours ago. 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    Allergies No Known Allergies  Level of Care/Admitting Diagnosis ED Disposition    ED Disposition Condition Comment   Admit  The patient appears reasonably stabilized for admission considering the current resources, flow, and capabilities available in the ED at this time, and I doubt any other [facility] requiring further screening and/or treatment in the ED prior to admission is  present.       B Medical/Surgery History Past Medical History:  Diagnosis Date  . Hypertension    Past Surgical History:  Procedure Laterality Date  . APPENDECTOMY    . dental implant    . ROTATOR CUFF REPAIR     Left  . WISDOM TOOTH EXTRACTION       A IV Location/Drains/Wounds Patient Lines/Drains/Airways Status   Active Line/Drains/Airways    Name:   Placement date:   Placement time:   Site:   Days:   Peripheral IV 11/25/18 Right Antecubital   11/25/18    0927    Antecubital   less than 1          Intake/Output Last 24 hours  Intake/Output Summary (Last 24 hours) at 11/25/2018 1120 Last data filed at 11/25/2018 1035 Gross per 24 hour  Intake 1000.42 ml  Output -  Net 1000.42 ml    Labs/Imaging Results for orders placed or performed during the hospital encounter of 11/25/18 (from the past 48 hour(s))  CBC with Differential     Status: Abnormal   Collection Time: 11/25/18  9:31 AM   Result Value Ref Range   WBC 11.1 (H) 4.0 - 10.5 K/uL   RBC 5.16 4.22 - 5.81 MIL/uL   Hemoglobin 16.4 13.0 - 17.0 g/dL   HCT 16.147.5 09.639.0 - 04.552.0 %   MCV 92.1 80.0 - 100.0 fL   MCH 31.8 26.0 - 34.0 pg   MCHC 34.5 30.0 - 36.0 g/dL   RDW 40.912.3 81.111.5 - 91.415.5 %   Platelets 259 150 - 400 K/uL   nRBC 0.0 0.0 - 0.2 %   Neutrophils Relative % 82 %   Neutro Abs 9.2 (H) 1.7 - 7.7 K/uL   Lymphocytes Relative 8 %   Lymphs Abs 0.9 0.7 - 4.0 K/uL   Monocytes Relative 7 %   Monocytes Absolute 0.7 0.1 - 1.0 K/uL   Eosinophils Relative 1 %   Eosinophils Absolute 0.1 0.0 - 0.5 K/uL   Basophils Relative 1 %   Basophils Absolute 0.1 0.0 - 0.1 K/uL   Immature Granulocytes 1 %   Abs Immature Granulocytes 0.06 0.00 - 0.07 K/uL    Comment: Performed at [facility], [address] [place], [place] [id number]  Basic metabolic panel     Status: Abnormal   Collection Time: 11/25/18  9:31 AM  Result Value Ref Range   Sodium 138 135 - 145 mmol/L   Potassium 3.9 3.5 - 5.1 mmol/L  Chloride 103 98 - 111 mmol/L   CO2 25 22 - 32 mmol/L   Glucose, Bld 105 (H) 70 - 99 mg/dL   BUN 14 6 - 20 mg/dL   Creatinine, Ser 4.62 0.61 - 1.24 mg/dL   Calcium 9.1 8.9 - 86.3 mg/dL   GFR calc non Af Amer >60 >60 mL/min   GFR calc Af Amer >60 >60 mL/min   Anion gap 10 5 - 15    Comment: Performed at [facility], [address], [place], [place] [address]  Troponin I - ONCE - STAT     Status: None   Collection Time: 11/25/18  9:31 AM  Result Value Ref Range   Troponin I <0.03 <0.03 ng/mL    Comment: Performed at [facility], [address], [place], [place] [address]  Lactic acid, plasma     Status: None   Collection Time: 11/25/18 10:10 AM  Result Value Ref Range   Lactic Acid, Venous 1.3 0.5 - 1.9 mmol/L    Comment: Performed at [facility], [address] [place], [place] [address]   Dg Chest Portable 1 View  Result Date:  11/25/2018 CLINICAL DATA:  Cough and fever EXAM: PORTABLE CHEST 1 VIEW COMPARISON:  Dec 15, 2013 FINDINGS: The lungs are clear. The heart size and pulmonary vascularity are normal. No adenopathy. No bone lesions. IMPRESSION: No edema or consolidation. Electronically Signed   By: Bretta Bang III M.D.   On: 11/25/2018 09:24    Pending Labs Unresulted Labs (From admission, onward)    Start     Ordered   11/25/18 0951  Blood culture (routine x 2)  BLOOD CULTURE X 2,   STAT     11/25/18 0950   11/25/18 0907  SARS Coronavirus 2 [facility] order, Performed in [facility] hospital lab)  (Novel Coronavirus, [facility] Order) with precautions panel)  Once,   R    Question:  Patient immune status  Answer:  Normal   11/25/18 0907          Vitals/Pain Today's Vitals   11/25/18 1028 11/25/18 1029 11/25/18 1030 11/25/18 1036  BP:   (!) 151/95   Pulse:   (!) 106   Resp: (!) 24 (!) 26 (!) 28   Temp:      TempSrc:      SpO2:   98%   Weight:      Height:      PainSc:    5     Isolation Precautions Droplet and Contact precautions  Medications Medications  albuterol (PROVENTIL HFA;VENTOLIN HFA) 108 (90 Base) MCG/ACT inhaler 1 puff (1 puff Inhalation Given 11/25/18 0911)  sodium chloride 0.9 % bolus 1,000 mL (0 mLs Intravenous Stopped 11/25/18 1035)  acetaminophen (TYLENOL) tablet 650 mg (650 mg Oral Given 11/25/18 1003)    Mobility walks Low fall risk   Focused Assessments     R Recommendations: See Admitting Provider Note  Report given to: Abby  Additional Notes: 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

## 2018-11-25 NOTE — Progress Notes (Signed)
Pt c/o feeling short of breath while lying in bed. O2 saturation 97% on room air. 2 LPM of O2 via [place] applied for comfort.

## 2018-11-25 NOTE — ED Notes (Signed)
Pt placed on 2L nasal cannula for comfort. Pt provided with urinal.

## 2018-11-25 NOTE — H&P (Signed)
History and Physical    ADARSH MUNDORF  ZOX:[id number]  DOB: May 26, 1976  DOA: 11/25/2018 PCP: System, Pcp Not In   Patient coming from: home  Chief Complaint: cough, dyspnea  HPI: Stephen Nguyen is a 43 y.o. male with medical history of HTN who presents for symptoms that started last night. He developed a sore throat and a cough last night and this has worsened today. He is having chest pain, abdominal discomfort and body aches as well. His cough is productive of a small amount of yellow sputum. No runny nose, earache or fullness in ears, no watery eyes. No vomiting or diarrhea. No other complaints. 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    ED Course: temp 100.2, HR in low 100s, pulse os 89 % when walking in the room, RR goes up to 30s when walking. WBC 11, CXR clear.   Review of Systems:  All other systems reviewed and apart from HPI, are negative.  Past Medical History:  Diagnosis Date  . Hypertension     Past Surgical History:  Procedure Laterality Date  . APPENDECTOMY    . dental implant    . ROTATOR CUFF REPAIR     Left  . WISDOM TOOTH EXTRACTION      Social History:   reports that he has never smoked. He has never used smokeless tobacco. He reports that he does not drink alcohol or use drugs.  No Known Allergies  Family history: Mother has HTN and reproductive cancer, father had skin cancer   Prior to Admission medications   Medication Sig Start Date End Date Taking? Authorizing Provider  albuterol (PROVENTIL HFA;VENTOLIN HFA) 108 (90 BASE) MCG/ACT inhaler Inhale 1-2 puffs into the lungs every 6 (six) hours as needed for wheezing or shortness of breath. 07/03/14  Yes Presson, Jess Barters H, PA  diclofenac sodium (VOLTAREN) 1 % GEL Apply 1 application topically 4 (four) times daily as needed (shoulder pain).  08/07/18  Yes [provider]  metoprolol tartrate (LOPRESSOR) 25 MG tablet Take 25 mg by mouth 2 (two) times  daily.   Yes [provider]  Multiple Vitamin (ONE-A-DAY MENS PO) Take 1 tablet by mouth daily.   Yes [provider]  omeprazole (PRILOSEC) 40 MG capsule Take 40 mg by mouth daily as needed for heartburn. 11/03/18  Yes [provider]  SUMAtriptan (IMITREX) 50 MG tablet Take 50 mg by mouth every 2 (two) hours as needed for migraine. May repeat in 2 hours if headache persists or recurs.   Yes [provider]  verapamil (CALAN-SR) 120 MG CR tablet Take 120 mg by mouth at bedtime.   Yes [provider]    Physical Exam: Wt Readings from Last 3 Encounters:  11/25/18 100.7 kg  08/05/18 103.4 kg  08/01/12 95.3 kg   Vitals:   11/25/18 1040 11/25/18 1100 11/25/18 1200 11/25/18 1230  BP:  (!) 141/103 (!) 131/95 116/88  Pulse:  (!) 108 (!) 113 (!) 111  Resp:  (!) 24 (!) 28 (!) 23  Temp: 98.2 F (36.8 C)     TempSrc: Oral     SpO2:  99% 97% 97%  Weight:      Height:          Constitutional:  Calm & comfortable Eyes: PERRLA, lids and conjunctivae normal ENT:  Mucous membranes are moist.  Pharynx clear of exudate   Normal dentition.  Neck: Supple, no masses  Respiratory:  Clear  to auscultation bilaterally  Normal respiratory effort.  Cardiovascular:  S1 & S2 heard, regular rate and rhythm No Murmurs Abdomen:  Non distended Mild mid abdominal tenderness, No masses Bowel sounds normal Extremities:  No clubbing / cyanosis No pedal edema No joint deformity    Skin:  No rashes, lesions or ulcers Neurologic:  AAO x 3 CN 2-12 grossly intact Sensation intact Strength 5/5 in all 4 extremities Psychiatric:  Normal Mood and affect    Labs on Admission: I have personally reviewed following labs and imaging studies  CBC: Recent Labs  Lab 11/25/18 0931  WBC 11.1*  NEUTROABS 9.2*  HGB 16.4  HCT 47.5  MCV 92.1  PLT 259   Basic Metabolic Panel: Recent Labs  Lab 11/25/18 0931  NA 138  K 3.9  CL 103  CO2 25  GLUCOSE 105*   BUN 14  CREATININE 1.17  CALCIUM 9.1   GFR: Estimated Creatinine Clearance: 95.8 mL/min (by C-G formula based on SCr of 1.17 mg/dL). Liver Function Tests: No results for input(s): AST, ALT, ALKPHOS, BILITOT, PROT, ALBUMIN in the last 168 hours. No results for input(s): LIPASE, AMYLASE in the last 168 hours. No results for input(s): AMMONIA in the last 168 hours. Coagulation Profile: No results for input(s): INR, PROTIME in the last 168 hours. Cardiac Enzymes: Recent Labs  Lab 11/25/18 0931  TROPONINI <0.03   BNP (last 3 results) No results for input(s): PROBNP in the last 8760 hours. HbA1C: No results for input(s): HGBA1C in the last 72 hours. CBG: No results for input(s): GLUCAP in the last 168 hours. Lipid Profile: No results for input(s): CHOL, HDL, LDLCALC, TRIG, CHOLHDL, LDLDIRECT in the last 72 hours. Thyroid Function Tests: No results for input(s): TSH, T4TOTAL, FREET4, T3FREE, THYROIDAB in the last 72 hours. Anemia Panel: No results for input(s): VITAMINB12, FOLATE, FERRITIN, TIBC, IRON, RETICCTPCT in the last 72 hours. Urine analysis: No results found for: COLORURINE, APPEARANCEUR, LABSPEC, PHURINE, GLUCOSEU, HGBUR, BILIRUBINUR, KETONESUR, PROTEINUR, UROBILINOGEN, NITRITE, LEUKOCYTESUR Sepsis Labs: @LABRCNTIP (procalcitonin:4,lacticidven:4) ) Recent Results (from the past 240 hour(s))  SARS Coronavirus 2 [facility](Hospital order, Performed in [facility] hospital lab)     Status: None   Collection Time: 11/25/18  9:31 AM  Result Value Ref Range Status   SARS Coronavirus 2 NEGATIVE NEGATIVE Final    Comment: (NOTE) If result is NEGATIVE SARS-CoV-2 target nucleic acids are NOT DETECTED. The SARS-CoV-2 RNA is generally detectable in upper and lower  respiratory specimens during the acute phase of infection. The lowest  concentration of SARS-CoV-2 viral copies this assay can detect is 250  copies / mL. A negative result does not preclude SARS-CoV-2 infection  and should  not be used as the sole basis for treatment or other  patient management decisions.  A negative result may occur with  improper specimen collection / handling, submission of specimen other  than nasopharyngeal swab, presence of viral mutation(s) within the  areas targeted by this assay, and inadequate number of viral copies  (<250 copies / mL). A negative result must be combined with clinical  observations, patient history, and epidemiological information. If result is POSITIVE SARS-CoV-2 target nucleic acids are DETECTED. The SARS-CoV-2 RNA is generally detectable in upper and lower  respiratory specimens dur ing the acute phase of infection.  Positive  results are indicative of active infection with SARS-CoV-2.  Clinical  correlation with patient history and other diagnostic information is  necessary to determine patient infection status.  Positive results do  not rule out  bacterial infection or co-infection with other viruses. If result is PRESUMPTIVE POSTIVE SARS-CoV-2 nucleic acids MAY BE PRESENT.   A presumptive positive result was obtained on the submitted specimen  and confirmed on repeat testing.  While 2019 novel coronavirus  (SARS-CoV-2) nucleic acids may be present in the submitted sample  additional confirmatory testing may be necessary for epidemiological  and / or clinical management purposes  to differentiate between  SARS-CoV-2 and other Sarbecovirus currently known to infect humans.  If clinically indicated additional testing with an alternate test  methodology [phone]) is advised. The SARS-CoV-2 RNA is generally  detectable in upper and lower respiratory sp ecimens during the acute  phase of infection. The expected result is Negative. Fact Sheet for Patients:  BoilerBrush.com.cy Fact Sheet for Healthcare Providers: https://pope.com/ This test is not yet approved or cleared by the [place] FDA and has been  authorized for detection and/or diagnosis of SARS-CoV-2 by FDA under an Emergency Use Authorization (EUA).  This EUA will remain in effect (meaning this test can be used) for the duration of the COVID-19 declaration under Section 564(b)(1) of the Act, 21 U.S.C. section 360bbb-3(b)(1), unless the authorization is terminated or revoked sooner. Performed at [facility], [address], [place], [place] [address]      Radiological Exams on Admission: Dg Chest Portable 1 View  Result Date: 11/25/2018 CLINICAL DATA:  Cough and fever EXAM: PORTABLE CHEST 1 VIEW COMPARISON:  Dec 15, 2013 FINDINGS: The lungs are clear. The heart size and pulmonary vascularity are normal. No adenopathy. No bone lesions. IMPRESSION: No edema or consolidation. Electronically Signed   By: Bretta Bang III M.D.   On: 11/25/2018 09:24    EKG: Independently reviewed. Sinus tachy  Assessment/Plan Principal Problem:   Acute respiratory failure - Leukocytosis - Covid is negative but 20-30 % of tests are false negatives- will need to monitor  - reppeat WBC count tomorrow - start Albuterol PRN, Mucinex DM, Tylenol, Cepacol PRN, Zofran PRN and slow IVF as he has abdominal discomfort and may not be able to eat - repeat pulse ox tomorrow  Active Problems:   Benign essential HTN - resume Metoprolol and Verapamil as BP is elevated  GERD - cont Protonix  Migraines - on Imitrex at home- have not ordered it yet    DVT prophylaxis: : Lovenox  Code Status: Full code  Family Communication:   Disposition Plan: home in 24-48 hrs  Consults called: none  Admission status: observation    Calvert Cantor MD [facility] Pager: [email or web address] Password [email or web address] [phone], please contact night-coverage   11/25/2018, 12:40 PM

## 2018-11-25 NOTE — ED Provider Notes (Addendum)
[facility]-EMERGENCY DEPT Provider Note   CSN: [id number] Arrival date & time: 11/25/18  4098  History   Chief Complaint Chief Complaint  Patient presents with   Cough   Fever   HPI Stephen Nguyen is a 43 y.o. male with past medical history significant for hypertension who presents for evaluation of cough.  States he woke up this morning he felt warm, chills with productive cough.  States he took a temperature at home which was 98.9.  Took Tylenol 2 hours PTA.  Cough productive of clear, white sputum.  States he has had some mild chest pain over the last 24 hours, however relates this to his frequent coughing. Denies N/V/D, congestion, rhinorrhea, sore throat, neck pain, neck stiffness, hemoptysis, aminal pain, diarrhea, dysuria, lower extremity edema, erythema, warmth, tenderness to bilateral calves. He has no history of PE or DVT. No cardiac history, no family history of cardiac disorders.  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  Patient had a telehealth visit earlier today and was told to proceed to the emergency department for evaluation. Symptoms onset with cough yesterday evening and additional symptoms this morning at 5AM. Denies exhertional CP, diaphoresis, radiation of pain. States he does have occasional SOB with coughing. No SOB with exertional or when he is not coughing. No pleuritic CP.  His superior at work is on the phone during the evaluation and is requesting for him to be tested for COVID due to his exposures. Denies recent travel or know exposures to COVID 19 positive patients.    HPI  Past Medical History:  Diagnosis Date   Hypertension     There are no active problems to display for this patient.   Past Surgical History:  Procedure Laterality Date   APPENDECTOMY     dental implant     ROTATOR CUFF REPAIR     Left   WISDOM TOOTH EXTRACTION           Home Medications    Prior to Admission medications   Medication Sig Start Date End Date Taking? Authorizing Provider  albuterol (PROVENTIL HFA;VENTOLIN HFA) 108 (90 BASE) MCG/ACT inhaler Inhale 1-2 puffs into the lungs every 6 (six) hours as needed for wheezing or shortness of breath. 07/03/14  Yes Presson, Jess Barters H, PA  diclofenac sodium (VOLTAREN) 1 % GEL Apply 1 application topically 4 (four) times daily as needed (shoulder pain).  08/07/18  Yes [provider]  metoprolol tartrate (LOPRESSOR) 25 MG tablet Take 25 mg by mouth 2 (two) times daily.   Yes [provider]  Multiple Vitamin (ONE-A-DAY MENS PO) Take 1 tablet by mouth daily.   Yes [provider]  omeprazole (PRILOSEC) 40 MG capsule Take 40 mg by mouth daily as needed for heartburn. 11/03/18  Yes [provider]  SUMAtriptan (IMITREX) 50 MG tablet Take 50 mg by mouth every 2 (two) hours as needed for migraine. May repeat in 2 hours if headache persists or recurs.   Yes [provider]  verapamil (CALAN-SR) 120 MG CR tablet Take 120 mg by mouth at bedtime.   Yes [provider]    Family History No family history on file.  Social History Social History   Tobacco Use   Smoking status: Never Smoker  Substance Use Topics   Alcohol use: No   Drug use: No     Allergies   Patient  has no known allergies.   Review of Systems Review of Systems  Constitutional: Positive for chills and fever.  HENT: Negative.   Respiratory: Positive for cough and shortness of breath. Negative for apnea, choking, chest tightness, wheezing and stridor.   Cardiovascular: Negative for palpitations and leg swelling.       CP with coughing  Gastrointestinal: Negative.   Genitourinary: Negative.   Musculoskeletal: Negative.   Skin: Negative.   Neurological: Negative.   All other systems reviewed and are negative.   Physical Exam Updated Vital Signs BP (!) 151/95     Pulse (!) 106    Temp 100.2 F (37.9 C) (Oral)    Resp (!) 28    Ht 5\' 10"  (1.778 m)    Wt 100.7 kg    SpO2 98%    BMI 31.85 kg/m   Physical Exam Vitals signs and nursing note reviewed.  Constitutional:      General: He is not in acute distress.    Appearance: He is not ill-appearing, toxic-appearing or diaphoretic.  HENT:     Head: Normocephalic and atraumatic.     Jaw: There is normal jaw occlusion.     Right Ear: Tympanic membrane, ear canal and external ear normal. There is no impacted cerumen. No hemotympanum. Tympanic membrane is not injected, scarred, perforated, erythematous, retracted or bulging.     Left Ear: Tympanic membrane, ear canal and external ear normal. There is no impacted cerumen. No hemotympanum. Tympanic membrane is not injected, scarred, perforated, erythematous, retracted or bulging.     Ears:     Comments: No Mastoid tenderness.    Nose:     Comments: No rhinorrhea and congestion to bilateral nares.  No sinus tenderness.    Mouth/Throat:     Comments: Posterior oropharynx clear.  Mucous membranes moist.  Tonsils without erythema or exudate.  Uvula midline without deviation.  No evidence of PTA or RPA.  No drooling, dysphasia or trismus.  Phonation normal. Neck:     Trachea: Trachea and phonation normal.     Meningeal: Brudzinski's sign and Kernig's sign absent.     Comments: No Neck stiffness or neck rigidity.  No meningismus.  No cervical lymphadenopathy. Cardiovascular:     Heart sounds: Normal heart sounds.     Comments: No murmurs rubs or gallops. Mild tachycardia in room at 107. Pulmonary:     Comments: Auscultation bilaterally without  rhonchi or rales. Mild expiratory wheeze in R>L upper airway. No accessory muscle usage.  Able speak in full sentences. Abdominal:     Comments: Soft, nontender without rebound or guarding.  No CVA tenderness.  Musculoskeletal:     Comments: Moves all 4 extremities without difficulty.  Lower extremities without edema,  erythema or warmth.  Skin:    Comments: Brisk capillary refill.  No rashes or lesions.  Neurological:     Mental Status: He is alert.     Comments: Ambulatory in department without difficulty.  Cranial nerves II through XII grossly intact.  No facial droop.  No aphasia.    ED Treatments / Results  Labs (all labs ordered are listed, but only abnormal results are displayed) Labs Reviewed  CBC WITH DIFFERENTIAL/PLATELET - Abnormal; Notable for the following components:      Result Value   WBC 11.1 (*)    Neutro Abs 9.2 (*)    All other components within normal limits  BASIC METABOLIC PANEL - Abnormal; Notable for the following components:   Glucose, Bld 105 (*)  All other components within normal limits  SARS CORONAVIRUS 2 (HOSPITAL ORDER, PERFORMED IN  HOSPITAL LAB)  CULTURE, BLOOD (ROUTINE X 2)  CULTURE, BLOOD (ROUTINE X 2)  TROPONIN I  LACTIC ACID, PLASMA    EKG EKG Interpretation  Date/Time:  Thursday November 25 2018 09:42:49 EDT Ventricular Rate:  104 PR Interval:    QRS Duration: 80 QT Interval:  316 QTC Calculation: 416 R Axis:   50 Text Interpretation:  Sinus tachycardia Borderline T abnormalities, inferior leads Baseline wander in lead(s) I III aVL Confirmed by Benjiman Core [id number]) on 11/25/2018 9:46:56 AM   Radiology Dg Chest Portable 1 View  Result Date: 11/25/2018 CLINICAL DATA:  Cough and fever EXAM: PORTABLE CHEST 1 VIEW COMPARISON:  Dec 15, 2013 FINDINGS: The lungs are clear. The heart size and pulmonary vascularity are normal. No adenopathy. No bone lesions. IMPRESSION: No edema or consolidation. Electronically Signed   By: Bretta Bang III M.D.   On: 11/25/2018 09:24    Procedures Procedures (including critical care time)  Medications Ordered in ED Medications  albuterol (PROVENTIL HFA;VENTOLIN HFA) 108 (90 Base) MCG/ACT inhaler 1 puff (1 puff Inhalation Given 11/25/18 0911)  sodium chloride 0.9 % bolus 1,000 mL (0 mLs Intravenous  Stopped 11/25/18 1035)  acetaminophen (TYLENOL) tablet 650 mg (650 mg Oral Given 11/25/18 1003)   Initial Impression / Assessment and Plan / ED Course  I have reviewed the triage vital signs and the nursing notes.  Pertinent labs & imaging results that were available during my care of the patient were reviewed by me and considered in my medical decision making (see chart for details).  43 year old male who presents for evaluation of cough and fever. Afebrile, non septic appearing. Cough x 12 hours PTA. Chills and CP with coughing onset this am.  Occasional shortness of breath when he has "coughing fits." Lungs with mild expiratory wheeze.  No rhonchi, rales. No assessory muscle usage. Speaks in full sentences without difficulty. No tachypnea. Mucous membranes dry. No N/V/D. Abdomen soft without rebound or guarding. Has had non exertional CP with coughing. No radiation, diaphoresis, hemoptysis, pleuritic CP. No personal or family cardiac hx. Lower extremities without edema, erythema, warmth or tenderness. Low suspicion for ACS, PE, dissection. 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 His superior is on the phone during evaluation and [employer] is requesting testing for COVID-19. Mildly tachycardic. No hypoxia or tachypnea at this time. Will order labs, troponin, chest xray and COVID testing and reevaluate. Blood cultures obtained.  1030: CBC with mild leukocytosis at 11.1, troponin negative, metabolic panel without electrolyte, renal or liver abnormality, coronavirus testing pending.  Chest x-ray without infiltrate, cardiomegaly, pneumothorax.  EKG without ischemic changes.  On reevaluation patient low-grade temperature 100.2, continues to be tachycardic as well as now tachypnic at 28.  I personally ambulated the patient around room.  Patient became extremely short of breath, tachypneic to 31 as well as hypoxic to 89%. Hypoxia did recover  quick with sitting. Given tachypnea and tachycardia will place patient on 2 L via nasal cannula for comfort. Lactic acid 1.3. Low suspicion for sepsis at this time.  Given hypoxia with ambulation with tachypnea will call for admission, Concern for COVID-19 given sx, exam and occupation. COVID testing sent on initial workup. Continued low suspicion for PE or sepsis as cause of his tachycardia and tachypnea.  Likely upper respiratory infection.  Will hold on antibiotics  given noncolored mucus as well as clear x-ray. No urinary symptoms, rashes, lesions, abdominal pain, diarrhea or emesis to suggest other cause of fever.  Patient feels improvement in SOB with 2L nasal cannula at this time. Discussed plan for admission and patient is in agreement with this plan. All questions answered.  1110: Consulted with Dr. Butler Denmark, [facility] hospitalist who agrees to evaluate patient for admission.  Recommends no antibiotics at this time.  Addendum: Stephen Nguyen was evaluated in Emergency Department on 11/25/2018 for the symptoms described in the history of present illness. He was evaluated in the context of the global COVID-19 pandemic, which necessitated consideration that the patient might be at risk for infection with the SARS-CoV-2 virus that causes COVID-19. Institutional protocols and algorithms that pertain to the evaluation of patients at risk for COVID-19 are in a state of rapid change based on information released by regulatory bodies including the CDC and federal and state organizations. These policies and algorithms were followed during the patient's care in the ED.       Final Clinical Impressions(s) / ED Diagnoses   Final diagnoses:  Hypoxia  Cough  Suspected Covid-19 Virus Infection    ED Discharge Orders    None       Abdelaziz Westenberger A, PA-C 11/25/18 1124     Herve Haug A, PA-C 11/25/18 1151    Benjiman Core, MD 11/25/18 1409

## 2018-11-26 DIAGNOSIS — Z20822 Contact with and (suspected) exposure to covid-19: Secondary | ICD-10-CM

## 2018-11-26 DIAGNOSIS — R6889 Other general symptoms and signs: Secondary | ICD-10-CM

## 2018-11-26 LAB — RESPIRATORY PANEL BY PCR

## 2018-11-26 LAB — COMPREHENSIVE METABOLIC PANEL
ALT: 35 U/L (ref 0–44)
AST: 22 U/L (ref 15–41)
Albumin: 3.6 g/dL (ref 3.5–5.0)
Alkaline Phosphatase: 57 U/L (ref 38–126)
Anion gap: 8 (ref 5–15)
BUN: 15 mg/dL (ref 6–20)
CO2: 24 mmol/L (ref 22–32)
Calcium: 8.2 mg/dL — ABNORMAL LOW (ref 8.9–10.3)
Chloride: 105 mmol/L (ref 98–111)
Creatinine, Ser: 1.2 mg/dL (ref 0.61–1.24)
GFR calc Af Amer: >60 mL/min (ref >60)
GFR calc non Af Amer: >60 mL/min (ref >60)
Glucose, Bld: 110 mg/dL — ABNORMAL HIGH (ref 70–99)
Potassium: 3.2 mmol/L — ABNORMAL LOW (ref 3.5–5.1)
Sodium: 137 mmol/L (ref 135–145)
Total Bilirubin: 1.7 mg/dL — ABNORMAL HIGH (ref 0.3–1.2)
Total Protein: 6.4 g/dL — ABNORMAL LOW (ref 6.5–8.1)

## 2018-11-26 LAB — FERRITIN: Ferritin: 204 ng/mL (ref 24–336)

## 2018-11-26 LAB — INFLUENZA PANEL BY PCR (TYPE A & B)
Influenza A By PCR: NEGATIVE
Influenza B By PCR: NEGATIVE

## 2018-11-26 LAB — C-REACTIVE PROTEIN: CRP: 5.8 mg/dL — ABNORMAL HIGH (ref <1.0)

## 2018-11-26 LAB — CBC
HCT: 42.2 % (ref 39.0–52.0)
Hemoglobin: 14 g/dL (ref 13.0–17.0)
MCH: 31 pg (ref 26.0–34.0)
MCHC: 33.2 g/dL (ref 30.0–36.0)
MCV: 93.4 fL (ref 80.0–100.0)
Platelets: 212 10*3/uL (ref 150–400)
RBC: 4.52 MIL/uL (ref 4.22–5.81)
RDW: 12.3 % (ref 11.5–15.5)
WBC: 10.2 10*3/uL (ref 4.0–10.5)
nRBC: 0 % (ref 0.0–0.2)

## 2018-11-26 LAB — PROCALCITONIN: Procalcitonin: 0.24 ng/mL

## 2018-11-26 LAB — HIV ANTIBODY (ROUTINE TESTING W REFLEX): HIV Screen 4th Generation wRfx: NONREACTIVE

## 2018-11-26 LAB — GROUP A STREP BY PCR: Group A Strep by PCR: NOT DETECTED

## 2018-11-26 MED ORDER — ACETAMINOPHEN 325 MG PO TABS: 650.0000 mg | ORAL_TABLET | Freq: Four times a day (QID) | ORAL | Status: AC | PRN

## 2018-11-26 MED ORDER — VITAMIN C 500 MG PO TABS: 500.0000 mg | ORAL_TABLET | Freq: Two times a day (BID) | ORAL | 0 refills | Status: DC

## 2018-11-26 MED ORDER — ZINC SULFATE 220 (50 ZN) MG PO CAPS: 220.0000 mg | ORAL_CAPSULE | Freq: Every day | ORAL | 0 refills | Status: AC

## 2018-11-26 MED ORDER — POTASSIUM CHLORIDE CRYS ER 20 MEQ PO TBCR
40.0000 meq | EXTENDED_RELEASE_TABLET | Freq: Once | ORAL | Status: AC
  Administered 2018-11-26: 09:00:00 40 meq via ORAL
  Filled 2018-11-26: qty 4

## 2018-11-26 NOTE — Discharge Instructions (Signed)
Person Under Monitoring Name: Stephen Nguyen [facility]  Location: [address] [place] [address]   Infection Prevention Recommendations for Individuals Confirmed to have, or Being Evaluated for, 2019 Novel Coronavirus (COVID-19) Infection Who Receive Care at Home  Individuals who are confirmed to have, or are being evaluated for, COVID-19 should follow the prevention steps below until a healthcare provider or local or state health department says they can return to normal activities.  Stay home except to get medical care You should restrict activities outside your home, except for getting medical care. Do not go to work, school, or public areas, and do not use public transportation or taxis.  Call ahead before visiting your doctor Before your medical appointment, call the healthcare provider and tell them that you have, or are being evaluated for, COVID-19 infection. This will help the healthcare providers office take steps to keep other people from getting infected. Ask your healthcare provider to call the local or state health department.  Monitor your symptoms Seek prompt medical attention if your illness is worsening (e.g., difficulty breathing). Before going to your medical appointment, call the healthcare provider and tell them that you have, or are being evaluated for, COVID-19 infection. Ask your healthcare provider to call the local or state health department.  Wear a facemask You should wear a facemask that covers your nose and mouth when you are in the same room with other people and when you visit a healthcare provider. People who live with or visit you should also wear a facemask while they are in the same room with you.  Separate yourself from other people in your home As much as possible, you should stay in a different room from other people in your home. Also, you should use a separate bathroom, if available.  Avoid sharing household items You should not  share dishes, drinking glasses, cups, eating utensils, towels, bedding, or other items with other people in your home. After using these items, you should wash them thoroughly with soap and water.  Cover your coughs and sneezes Cover your mouth and nose with a tissue when you cough or sneeze, or you can cough or sneeze into your sleeve. Throw used tissues in a lined trash can, and immediately wash your hands with soap and water for at least 20 seconds or use an alcohol-based hand rub.  Wash your [employer] your hands often and thoroughly with soap and water for at least 20 seconds. You can use an alcohol-based hand sanitizer if soap and water are not available and if your hands are not visibly dirty. Avoid touching your eyes, nose, and mouth with unwashed hands.   Prevention Steps for Caregivers and Household Members of Individuals Confirmed to have, or Being Evaluated for, COVID-19 Infection Being Cared for in the Home  If you live with, or provide care at home for, a person confirmed to have, or being evaluated for, COVID-19 infection please follow these guidelines to prevent infection:  Follow healthcare providers instructions Make sure that you understand and can help the patient follow any healthcare provider instructions for all care.  Provide for the patients basic needs You should help the patient with basic needs in the home and provide support for getting groceries, prescriptions, and other personal needs.  Monitor the patients symptoms If they are getting sicker, call his or her medical provider and tell them that the patient has, or is being evaluated for, COVID-19 infection. This will help the healthcare providers office  take steps to keep other people from getting infected. Ask the healthcare provider to call the local or state health department.  Limit the number of people who have contact with the patient  If possible, have only one caregiver for the  patient.  Other household members should stay in another home or place of residence. If this is not possible, they should stay  in another room, or be separated from the patient as much as possible. Use a separate bathroom, if available.  Restrict visitors who do not have an essential need to be in the home.  Keep older adults, very young children, and other sick people away from the patient Keep older adults, very young children, and those who have compromised immune systems or chronic health conditions away from the patient. This includes people with chronic heart, lung, or kidney conditions, diabetes, and cancer.  Ensure good ventilation Make sure that shared spaces in the home have good air flow, such as from an air conditioner or an opened window, weather permitting.  Wash your hands often  Wash your hands often and thoroughly with soap and water for at least 20 seconds. You can use an alcohol based hand sanitizer if soap and water are not available and if your hands are not visibly dirty.  Avoid touching your eyes, nose, and mouth with unwashed hands.  Use disposable paper towels to dry your hands. If not available, use dedicated cloth towels and replace them when they become wet.  Wear a facemask and gloves  Wear a disposable facemask at all times in the room and gloves when you touch or have contact with the patients blood, body fluids, and/or secretions or excretions, such as sweat, saliva, sputum, nasal mucus, vomit, urine, or feces.  Ensure the mask fits over your nose and mouth tightly, and do not touch it during use.  Throw out disposable facemasks and gloves after using them. Do not reuse.  Wash your hands immediately after removing your facemask and gloves.  If your personal clothing becomes contaminated, carefully remove clothing and launder. Wash your hands after handling contaminated clothing.  Place all used disposable facemasks, gloves, and other waste in a lined  container before disposing them with other household waste.  Remove gloves and wash your hands immediately after handling these items.  Do not share dishes, glasses, or other household items with the patient  Avoid sharing household items. You should not share dishes, drinking glasses, cups, eating utensils, towels, bedding, or other items with a patient who is confirmed to have, or being evaluated for, COVID-19 infection.  After the person uses these items, you should wash them thoroughly with soap and water.  Wash laundry thoroughly  Immediately remove and wash clothes or bedding that have blood, body fluids, and/or secretions or excretions, such as sweat, saliva, sputum, nasal mucus, vomit, urine, or feces, on them.  Wear gloves when handling laundry from the patient.  Read and follow directions on labels of laundry or clothing items and detergent. In general, wash and dry with the warmest temperatures recommended on the label.  Clean all areas the individual has used often  Clean all touchable surfaces, such as counters, tabletops, doorknobs, bathroom fixtures, toilets, phones, keyboards, tablets, and bedside tables, every day. Also, clean any surfaces that may have blood, body fluids, and/or secretions or excretions on them.  Wear gloves when cleaning surfaces the patient has come in contact with.  Use a diluted bleach solution (e.g., dilute bleach with 1 part  bleach and 10 parts water) or a household disinfectant with a label that says EPA-registered for coronaviruses. To make a bleach solution at home, add 1 tablespoon of bleach to 1 quart (4 cups) of water. For a larger supply, add  cup of bleach to 1 gallon (16 cups) of water.  Read labels of cleaning products and follow recommendations provided on product labels. Labels contain instructions for safe and effective use of the cleaning product including precautions you should take when applying the product, such as wearing gloves or  eye protection and making sure you have good ventilation during use of the product.  Remove gloves and wash hands immediately after cleaning.  Monitor yourself for signs and symptoms of illness Caregivers and household members are considered close contacts, should monitor their health, and will be asked to limit movement outside of the home to the extent possible. Follow the monitoring steps for close contacts listed on the symptom monitoring form.   ? If you have additional questions, contact your local health department or call the epidemiologist on call at [phone] (available 24/7). ? This guidance is subject to change. For the most up-to-date guidance from [facility], please refer to their website: YouBlogs.pl

## 2018-11-26 NOTE — Discharge Summary (Signed)
Stephen Nguyen, is a 43 y.o. male  DOB 21-Aug-1975  MRN [id number].  Admission date:  11/25/2018  Admitting Physician  Calvert Cantor, MD  Discharge Date:  11/26/2018   Primary MD  System, Pcp Not In  Recommendations for primary care physician for things to follow:  -Please follow on the final results of an COVID-19 test which has been sent to [facility] -Patient has been given [facility] Department of Health infection prevention recommendation  Admission Diagnosis  Cough [R05] Hypoxia [R09.02] Suspected Covid-19 Virus Infection [R68.89] Acute respiratory failure (HCC) [J96.00]   Discharge Diagnosis  Cough [R05] Hypoxia [R09.02] Suspected Covid-19 Virus Infection [R68.89] Acute respiratory failure (HCC) [J96.00]    Active Problems:   Benign essential HTN   Leukocytosis   Suspected Covid-19 Virus Infection      Past Medical History:  Diagnosis Date   Hypertension     Past Surgical History:  Procedure Laterality Date   APPENDECTOMY     dental implant     ROTATOR CUFF REPAIR     Left   WISDOM TOOTH EXTRACTION         History of present illness and  Hospital Course:     Kindly see H&P for history of present illness and admission details, please review complete Labs, Consult reports and Test reports for all details in brief  HPI  from the history and physical done on the day of admission 11/25/2018  HPI: Stephen Nguyen is a 43 y.o. male with medical history of HTN who presents for symptoms that started last night. He developed a sore throat and a cough last night and this has worsened today. He is having chest pain, abdominal discomfort and body aches as well. His cough is productive of a small amount of yellow sputum. No runny nose, earache or fullness in ears, no watery eyes. No vomiting or diarrhea. No other complaints. 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    ED Course: temp 100.2, HR in low 100s, pulse os 89 % when walking in the room, RR goes up to 30s when walking. WBC 11, CXR clear.   Hospital Course   HPI: Stephen Nguyen is a 43 y.o. male with medical history of HTN who presents for symptoms that started last night. He developed a sore throat and a cough last night and this has worsened today. He is having chest pain, abdominal discomfort and body aches as well. His cough is productive of a small amount of yellow sputum. No runny nose, earache or fullness in ears, no watery eyes. No vomiting or diarrhea. No other complaints. 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    ED Course: temp 100.2, HR in low 100s, pulse os 89 % when walking in the room, RR goes up to 30s when walking. WBC 11, CXR clear.   Suspected COVID-19 infection -Patient presents with dyspnea, cough, fever, loss of taste, shortness of breath, briefly any nausea,  diarrhea and sore throat, and symptoms is highly suspicious for COVID-19 infection, even though he had negative COVID-19 test by PCR, up to 30% can be false positive, so his admitting diagnoses likely related to COVID-19 infection, patient in the absence of other source of his fever or infection, as his rapid test was negative, influenza test is negative, and respiratory PCR panel is negative as well, I have discussed with ID, they recommend repeat COVID-19 testing by [facility], which has been sent, results are pending at time of discharge, have discussed with the patient, even if that test is negative, ankle findings almost diagnostic of COVID-19, so hewasinstructed to his pulse ox daily to continue with vitamin C, zinc, Tylenol for fever, keep well-hydrated, and to follow [place] infection prevention recommendation for COVID-19, this morning patient saturating 100% on room air without any oxygen requirement, he was instructed to by pulse ox and monitor his saturation  daily.  Hypertension -Continue with home meds  GERD -Continue with PPI  Discharge Condition:  stable Discussed with wife via phone     Discharge Instructions  and  Discharge Medications    Discharge Instructions    Discharge instructions   Complete by:  As directed    -Take Tylenol for fever, avoid NSAIDs -Continue to monitor your oxygen saturation via pulse ox daily, contact your PCP, or call ED if oxygen saturation is equal or less than 91%  Follow with Primary MD System, Get CBC, CMP, 2 view Chest X ray checked  by Primary MD next visit.    Activity: As tolerated with Full fall precautions use walker/cane & assistance as needed   Disposition Home    Diet: Heart Healthy   On your next visit with your primary care physician please Get Medicines reviewed and adjusted.   Please request your Prim.MD to go over all Hospital Tests and Procedure/Radiological results at the follow up, please get all Hospital records sent to your Prim MD by signing hospital release before you go home.   If you experience worsening of your admission symptoms, develop shortness of breath, life threatening emergency, suicidal or homicidal thoughts you must seek medical attention immediately by calling 911 or calling your MD immediately  if symptoms less severe.  You Must read complete instructions/literature along with all the possible adverse reactions/side effects for all the Medicines you take and that have been prescribed to you. Take any new Medicines after you have completely understood and accpet all the possible adverse reactions/side effects.   Do not drive, operating heavy machinery, perform activities at heights, swimming or participation in water activities or provide baby sitting services if your were admitted for syncope or siezures until you have seen by Primary MD or a Neurologist and advised to do so again.  Do not drive when taking Pain medications.    Do not take more than  prescribed Pain, Sleep and Anxiety Medications  Special Instructions: If you have smoked or chewed Tobacco  in the last 2 yrs please stop smoking, stop any regular Alcohol  and or any Recreational drug use.  Wear Seat belts while driving.   Please note  You were cared for by a hospitalist during your hospital stay. If you have any questions about your discharge medications or the care you received while you were in the hospital after you are discharged, you can call the unit and asked to speak with the hospitalist on call if the hospitalist that took care of you is not available. Once  you are discharged, your primary care physician will handle any further medical issues. Please note that NO REFILLS for any discharge medications will be authorized once you are discharged, as it is imperative that you return to your primary care physician (or establish a relationship with a primary care physician if you do not have one) for your aftercare needs so that they can reassess your need for medications and monitor your lab values.   Increase activity slowly   Complete by:  As directed    MyChart COVID-19 home monitoring program   Complete by:  Nov 26, 2018    Is the patient willing to use a smartphone for remote monitoring via the MyChart app?:  Yes   Temperature monitoring   Complete by:  Nov 26, 2018    After how many days would you like to receive a notification of this patient's flowsheet entries?:  1     Allergies as of 11/26/2018   No Known Allergies     Medication List    STOP taking these medications   diclofenac sodium 1 % Gel Commonly known as:  VOLTAREN     TAKE these medications   acetaminophen 325 MG tablet Commonly known as:  TYLENOL Take 2 tablets (650 mg total) by mouth every 6 (six) hours as needed for mild pain or fever (or Fever >/= 101).   albuterol 108 (90 Base) MCG/ACT inhaler Commonly known as:  VENTOLIN HFA Inhale 1-2 puffs into the lungs every 6 (six) hours as  needed for wheezing or shortness of breath.   metoprolol tartrate 25 MG tablet Commonly known as:  LOPRESSOR Take 25 mg by mouth 2 (two) times daily.   omeprazole 40 MG capsule Commonly known as:  PRILOSEC Take 40 mg by mouth daily as needed for heartburn.   ONE-A-DAY MENS PO Take 1 tablet by mouth daily.   SUMAtriptan 50 MG tablet Commonly known as:  IMITREX Take 50 mg by mouth every 2 (two) hours as needed for migraine. May repeat in 2 hours if headache persists or recurs.   verapamil 120 MG CR tablet Commonly known as:  CALAN-SR Take 120 mg by mouth at bedtime.   vitamin C 500 MG tablet Commonly known as:  ASCORBIC ACID Take 1 tablet (500 mg total) by mouth 2 (two) times daily.   zinc sulfate 220 (50 Zn) MG capsule Commonly known as:  Zinc-220 Take 1 capsule (220 mg total) by mouth daily for 15 days.         Diet and Activity recommendation: See Discharge Instructions above   Consults obtained -  D/W ID via phone   Major procedures and Radiology Reports - PLEASE review detailed and final reports for all details, in brief -      Dg Chest Portable 1 View  Result Date: 11/25/2018 CLINICAL DATA:  Cough and fever EXAM: PORTABLE CHEST 1 VIEW COMPARISON:  Dec 15, 2013 FINDINGS: The lungs are clear. The heart size and pulmonary vascularity are normal. No adenopathy. No bone lesions. IMPRESSION: No edema or consolidation. Electronically Signed   By: Bretta Bang III M.D.   On: 11/25/2018 09:24    Micro Results    Recent Results (from the past 240 hour(s))  SARS Coronavirus 2 [facility] order, Performed in [facility] hospital lab)     Status: None   Collection Time: 11/25/18  9:31 AM  Result Value Ref Range Status   SARS Coronavirus 2 NEGATIVE NEGATIVE Final    Comment: (NOTE) If result  is NEGATIVE SARS-CoV-2 target nucleic acids are NOT DETECTED. The SARS-CoV-2 RNA is generally detectable in upper and lower  respiratory specimens during the acute phase of  infection. The lowest  concentration of SARS-CoV-2 viral copies this assay can detect is 250  copies / mL. A negative result does not preclude SARS-CoV-2 infection  and should not be used as the sole basis for treatment or other  patient management decisions.  A negative result may occur with  improper specimen collection / handling, submission of specimen other  than nasopharyngeal swab, presence of viral mutation(s) within the  areas targeted by this assay, and inadequate number of viral copies  (<250 copies / mL). A negative result must be combined with clinical  observations, patient history, and epidemiological information. If result is POSITIVE SARS-CoV-2 target nucleic acids are DETECTED. The SARS-CoV-2 RNA is generally detectable in upper and lower  respiratory specimens dur ing the acute phase of infection.  Positive  results are indicative of active infection with SARS-CoV-2.  Clinical  correlation with patient history and other diagnostic information is  necessary to determine patient infection status.  Positive results do  not rule out bacterial infection or co-infection with other viruses. If result is PRESUMPTIVE POSTIVE SARS-CoV-2 nucleic acids MAY BE PRESENT.   A presumptive positive result was obtained on the submitted specimen  and confirmed on repeat testing.  While 2019 novel coronavirus  (SARS-CoV-2) nucleic acids may be present in the submitted sample  additional confirmatory testing may be necessary for epidemiological  and / or clinical management purposes  to differentiate between  SARS-CoV-2 and other Sarbecovirus currently known to infect humans.  If clinically indicated additional testing with an alternate test  methodology [phone]) is advised. The SARS-CoV-2 RNA is generally  detectable in upper and lower respiratory sp ecimens during the acute  phase of infection. The expected result is Negative. Fact Sheet for Patients:   BoilerBrush.com.cy Fact Sheet for Healthcare Providers: https://pope.com/ This test is not yet approved or cleared by the [place] FDA and has been authorized for detection and/or diagnosis of SARS-CoV-2 by FDA under an Emergency Use Authorization (EUA).  This EUA will remain in effect (meaning this test can be used) for the duration of the COVID-19 declaration under Section 564(b)(1) of the Act, 21 U.S.C. section 360bbb-3(b)(1), unless the authorization is terminated or revoked sooner. Performed at [facility], [address] [place], [place] [address]   Blood culture (routine x 2)     Status: None (Preliminary result)   Collection Time: 11/25/18 10:10 AM  Result Value Ref Range Status   Specimen Description   Final    BLOOD LEFT ANTECUBITAL Performed at [facility], [address], [place], [place] [address]    Special Requests   Final    BOTTLES DRAWN AEROBIC AND ANAEROBIC Blood Culture results may not be optimal due to an inadequate volume of blood received in culture bottles Performed at [facility], [address] [place], [place] [address]    Culture   Final    NO GROWTH 1 DAY Performed at [facility], [address], [place], [place] [address]    Report Status PENDING  Incomplete  Blood culture (routine x 2)     Status: None (Preliminary result)   Collection Time: 11/25/18 10:10 AM  Result Value Ref Range Status   Specimen Description   Final    BLOOD RIGHT ANTECUBITAL Performed at [facility],  [address] [place], [place] [address]    Special Requests   Final    BOTTLES DRAWN AEROBIC AND ANAEROBIC Blood Culture adequate volume Performed at [facility], [address], [place], [place] [address]    Culture   Final    NO GROWTH 1 DAY Performed at [facility], [address], [place], [place] [address]     Report Status PENDING  Incomplete  Group A Strep by PCR     Status: None   Collection Time: 11/26/18  9:01 AM  Result Value Ref Range Status   Group A Strep by PCR NOT DETECTED NOT DETECTED Final    Comment: Performed at [facility], [address] [place], [place] [address]  Respiratory Panel by PCR     Status: None   Collection Time: 11/26/18  9:01 AM  Result Value Ref Range Status   Adenovirus NOT DETECTED NOT DETECTED Final   Coronavirus 229E NOT DETECTED NOT DETECTED Final    Comment: (NOTE) The Coronavirus on the Respiratory Panel, DOES NOT test for the novel  Coronavirus (2019 nCoV)    Coronavirus HKU1 NOT DETECTED NOT DETECTED Final   Coronavirus NL63 NOT DETECTED NOT DETECTED Final   Coronavirus OC43 NOT DETECTED NOT DETECTED Final   Metapneumovirus NOT DETECTED NOT DETECTED Final   Rhinovirus / Enterovirus NOT DETECTED NOT DETECTED Final   Influenza A NOT DETECTED NOT DETECTED Final   Influenza B NOT DETECTED NOT DETECTED Final   Parainfluenza Virus 1 NOT DETECTED NOT DETECTED Final   Parainfluenza Virus 2 NOT DETECTED NOT DETECTED Final   Parainfluenza Virus 3 NOT DETECTED NOT DETECTED Final   Parainfluenza Virus 4 NOT DETECTED NOT DETECTED Final   Respiratory Syncytial Virus NOT DETECTED NOT DETECTED Final   Bordetella pertussis NOT DETECTED NOT DETECTED Final   Chlamydophila pneumoniae NOT DETECTED NOT DETECTED Final   Mycoplasma pneumoniae NOT DETECTED NOT DETECTED Final    Comment: Performed at [facility], [address], [place], [place] [address]       Today   Subjective:   Stephen Nguyen today has no headache,no chest or abdominal pain,no new weakness tingling or numbness, feels much better wants to go home today.  Reports some diarrhea, had fever 100.7  Objective:   Blood pressure 131/84, pulse 86, temperature (!) 100.7 F (38.2 C), temperature source Oral, resp. rate (!) 21, height  (1.778 m), weight 100.7 kg,  SpO2 96 %.   Intake/Output Summary (Last 24 hours) at 11/26/2018 1655 Last data filed at 11/26/2018 1500 Gross per 24 hour  Intake 1311.5 ml  Output --  Net 1311.5 ml    Exam Awake Alert, Oriented x 3, No new F.N deficits, Normal affect Symmetrical Chest wall movement, Good air movement bilaterally, CTAB RRR,No Gallops,Rubs or new Murmurs, No Parasternal Heave +ve B.Sounds, Abd Soft, Non tender, No rebound -guarding or rigidity. No Cyanosis, Clubbing or edema, No new Rash or bruise  Data Review   CBC w Diff:  Lab Results  Component Value Date   WBC 10.2 11/26/2018   HGB 14.0 11/26/2018   HCT 42.2 11/26/2018   PLT 212 11/26/2018   LYMPHOPCT 8 11/25/2018   MONOPCT 7 11/25/2018   EOSPCT 1 11/25/2018   BASOPCT 1 11/25/2018    CMP:  Lab Results  Component Value Date   NA 137 11/26/2018   K 3.2 (L) 11/26/2018   CL 105 11/26/2018   CO2 24 11/26/2018  BUN 15 11/26/2018   CREATININE 1.20 11/26/2018   PROT 6.4 (L) 11/26/2018   ALBUMIN 3.6 11/26/2018   BILITOT 1.7 (H) 11/26/2018   ALKPHOS 57 11/26/2018   AST 22 11/26/2018   ALT 35 11/26/2018  .   Total Time in preparing paper work, data evaluation and todays exam - 35 minutes  Huey Bienenstockawood Jeily Guthridge M.D on 11/26/2018 at 4:55 PM  [facility]   Office  [phone]

## 2018-11-26 NOTE — Progress Notes (Signed)
Assumed care of patient from Launa Flight, RN at approximately 1500. Agree with previously documented nursing assessment. Pt is in stable condition and resting comfortably at this time. Will continue to monitor.

## 2018-11-27 LAB — NOVEL CORONAVIRUS, NAA (HOSP ORDER, SEND-OUT TO REF LAB; TAT 18-24 HRS): SARS-CoV-2, NAA: NOT DETECTED

## 2018-11-30 LAB — CULTURE, BLOOD (ROUTINE X 2)
Culture: NO GROWTH
Culture: NO GROWTH
Special Requests: ADEQUATE

## 2018-12-03 ENCOUNTER — Telehealth (INDEPENDENT_AMBULATORY_CARE_PROVIDER_SITE_OTHER): Payer: Self-pay | Admitting: General Practice

## 2018-12-03 ENCOUNTER — Encounter (INDEPENDENT_AMBULATORY_CARE_PROVIDER_SITE_OTHER): Payer: Self-pay

## 2018-12-03 NOTE — Telephone Encounter (Signed)
Texted mychart link to pt. Called and spoke to pt to notify of link and MyChart COVID19 Home Condition Monitoring questionnaire. Pt will get signed up. Will f/u next week.

## 2018-12-06 ENCOUNTER — Encounter (INDEPENDENT_AMBULATORY_CARE_PROVIDER_SITE_OTHER): Payer: Self-pay

## 2018-12-07 ENCOUNTER — Encounter (INDEPENDENT_AMBULATORY_CARE_PROVIDER_SITE_OTHER): Payer: Self-pay

## 2018-12-09 ENCOUNTER — Encounter (INDEPENDENT_AMBULATORY_CARE_PROVIDER_SITE_OTHER): Payer: Self-pay

## 2018-12-28 IMAGING — CR DG ELBOW COMPLETE 3+V*R*
4 series · 4 of 4 positions shown · non-contrast
Comparison: None.

CLINICAL DATA: Fell and injured right elbow today.

EXAM:
RIGHT ELBOW - COMPLETE 3+ VIEW

[x elbow lat right]
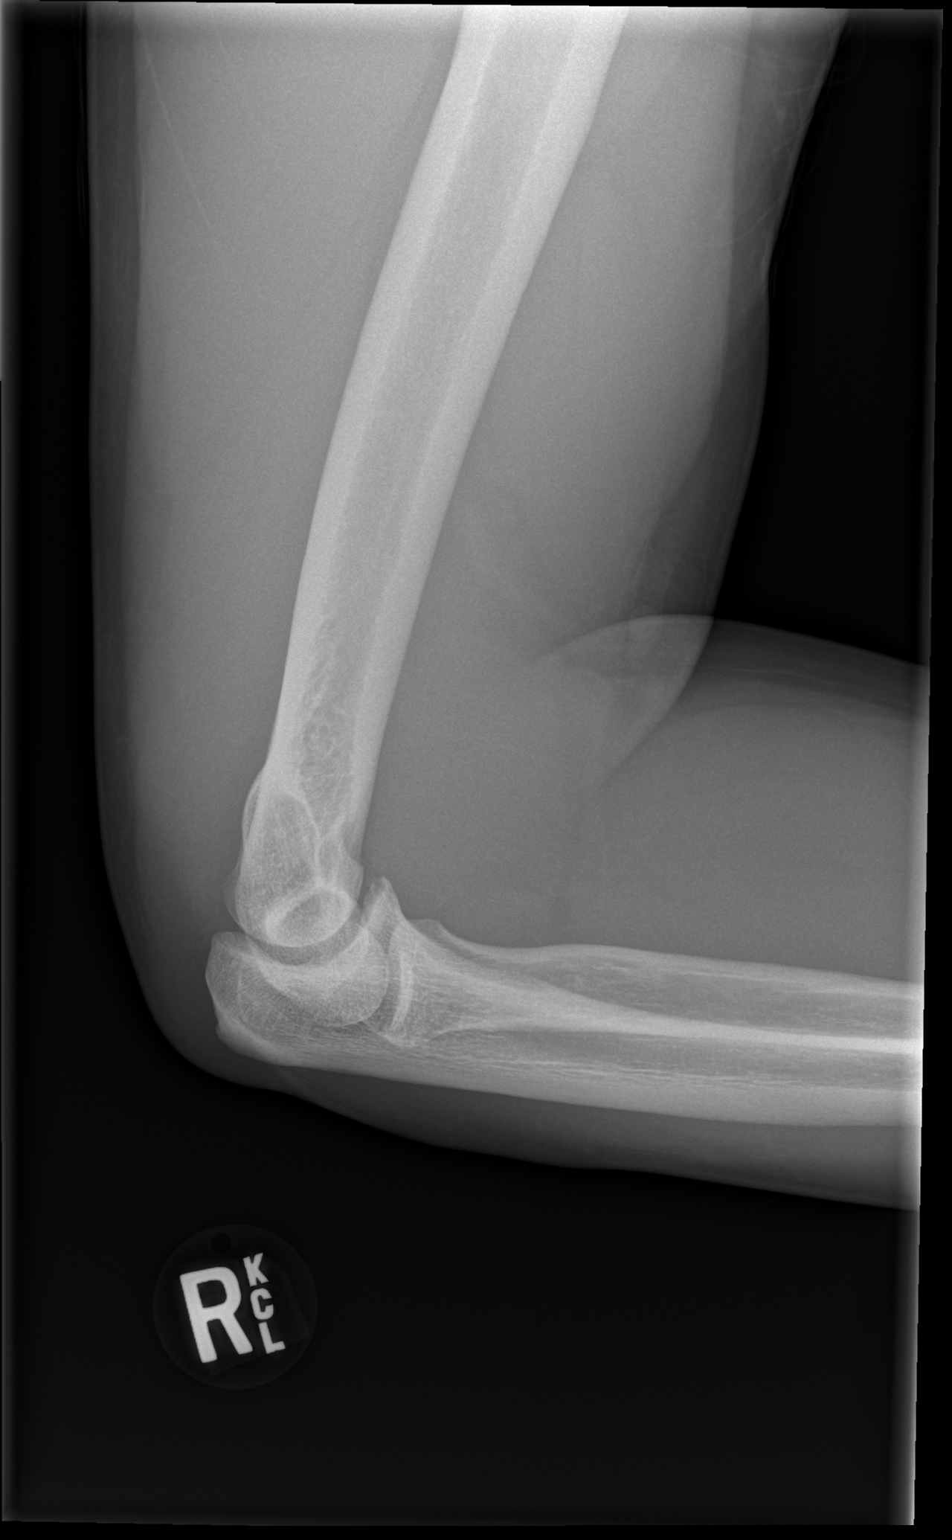

[x elbow ap right]
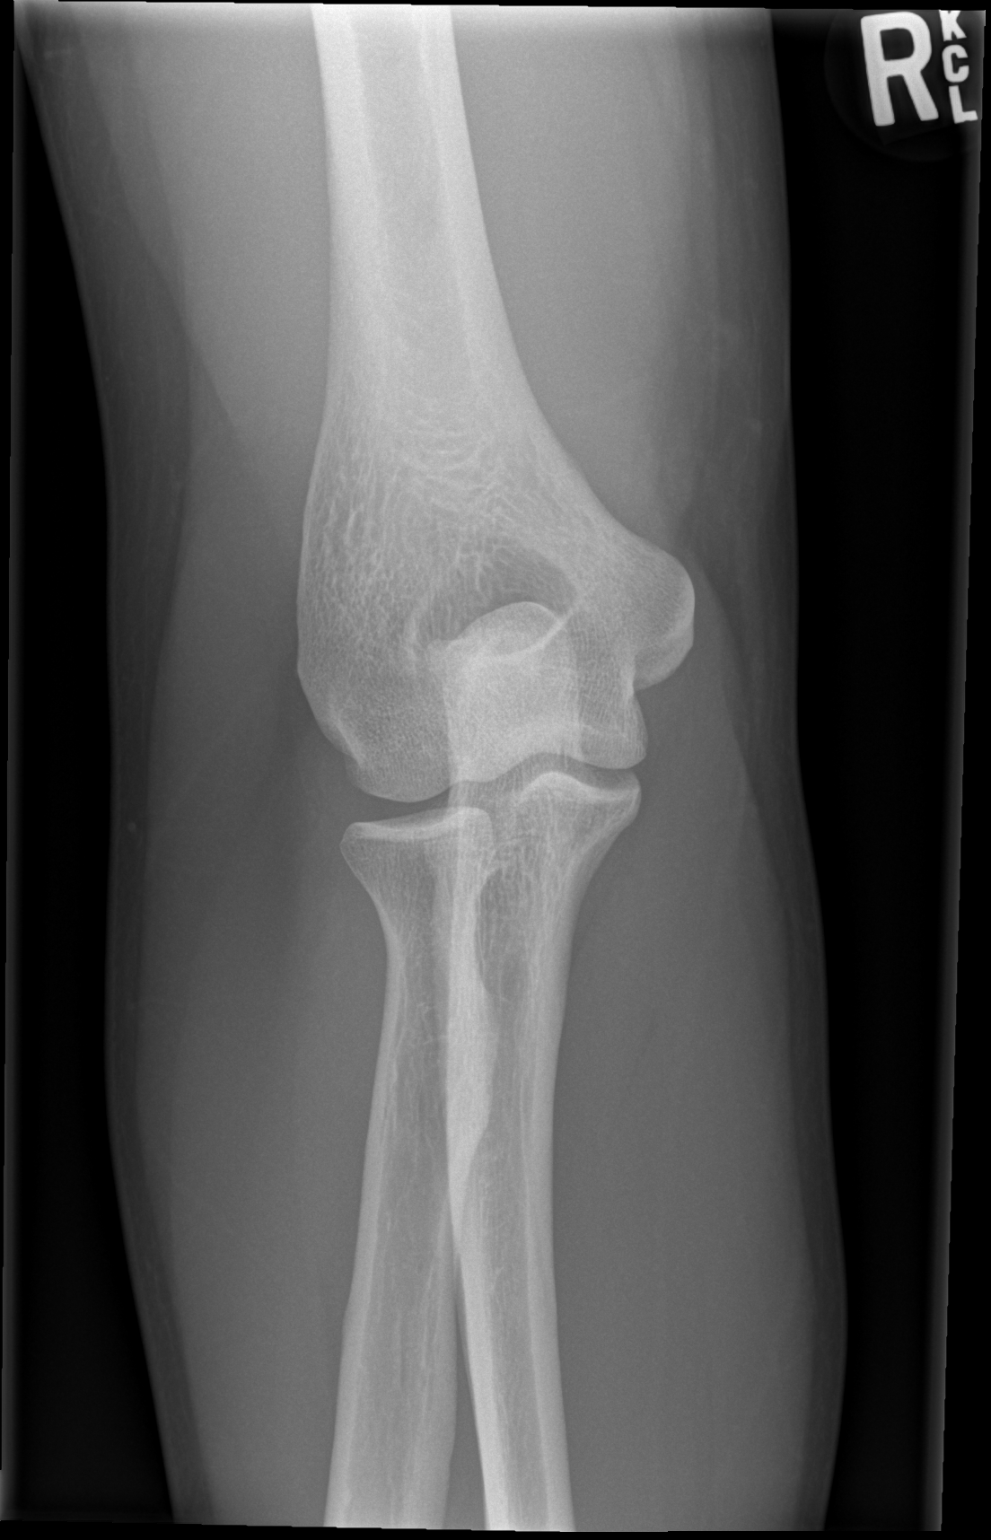

[x elbow obl right (1 of 2)]
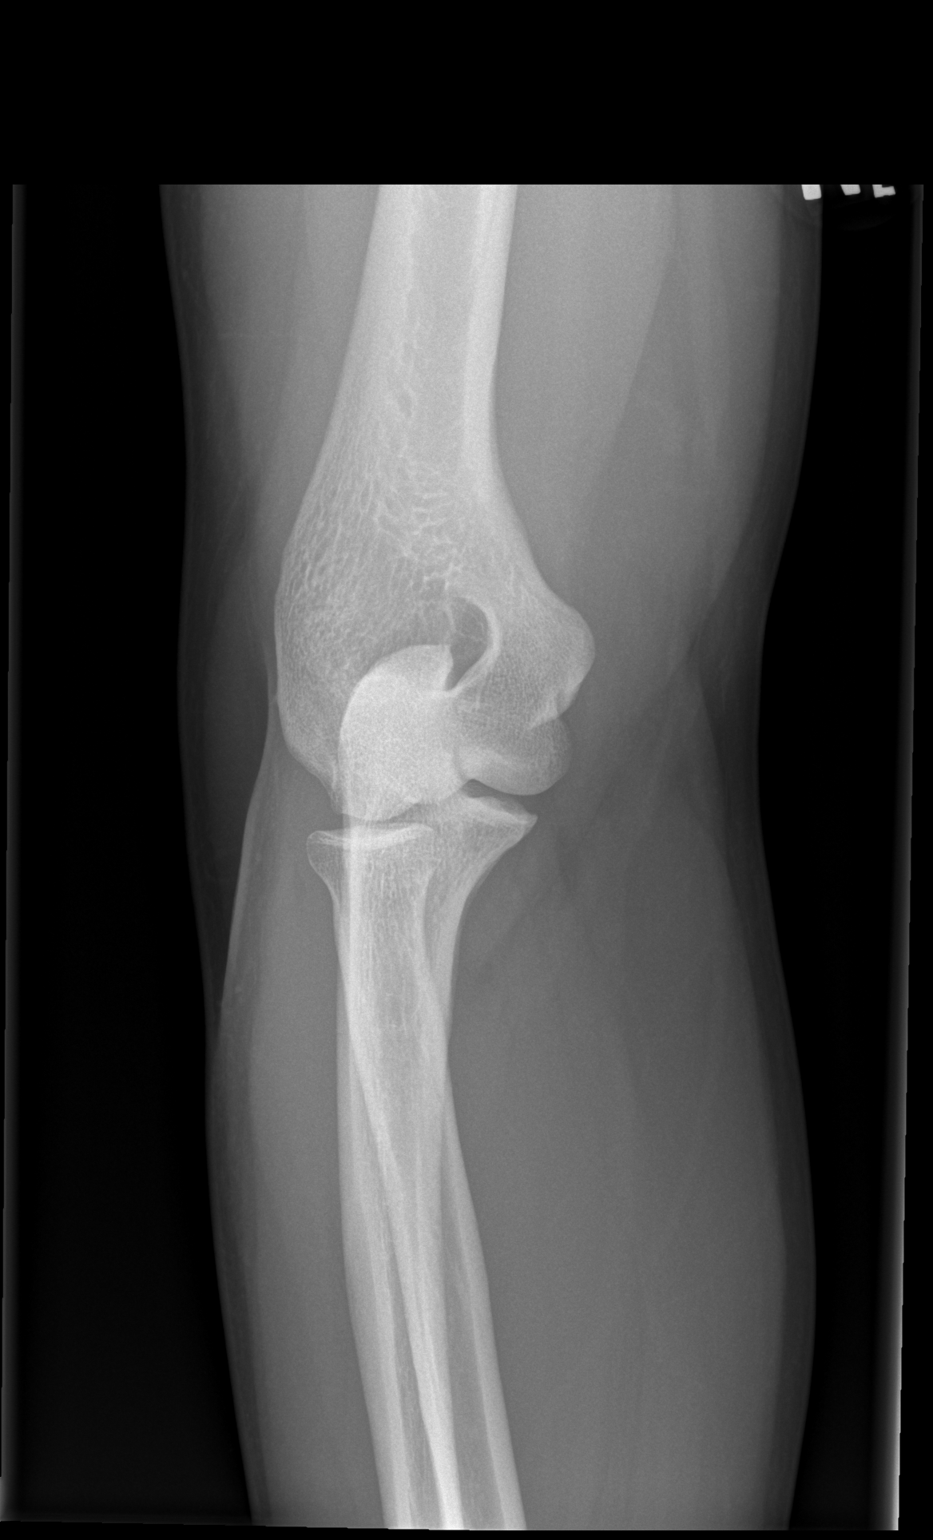

[x elbow obl right (2 of 2)]
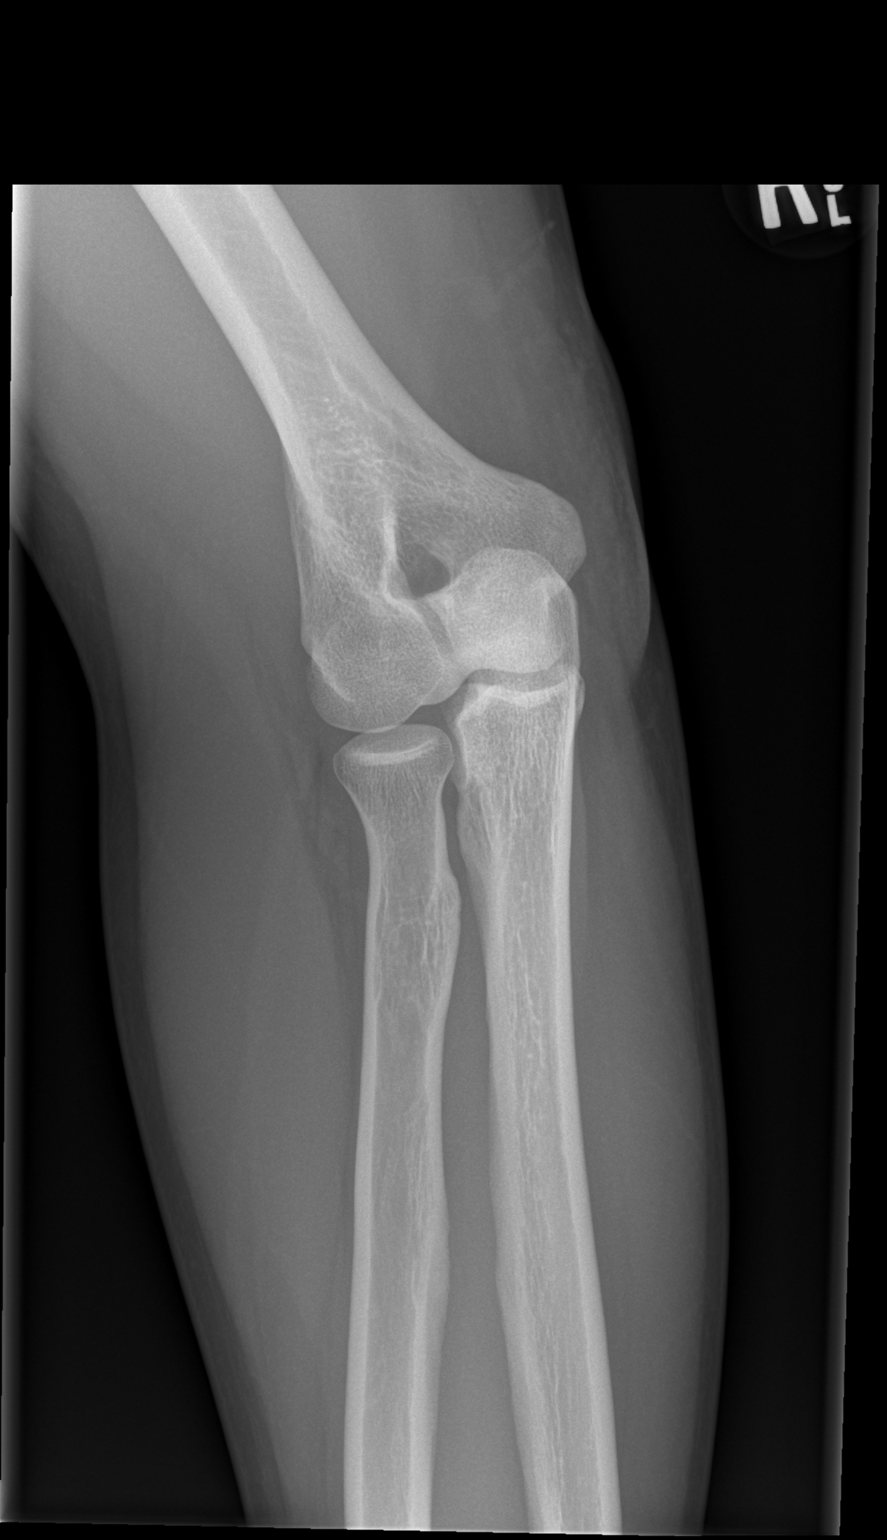

[4 of 4 positions shown; findings below may reference images not displayed]

FINDINGS: The joint spaces are maintained. No acute fracture. No degenerative
changes. No osteochondral lesion. No joint effusion.
IMPRESSION: No acute fracture or joint effusion.

## 2019-04-19 IMAGING — DX PORTABLE CHEST - 1 VIEW
1 series · 1 of 1 positions shown · non-contrast
Comparison: December 15, 2013

CLINICAL DATA: Cough and fever

EXAM:
PORTABLE CHEST 1 VIEW

[chest ap]
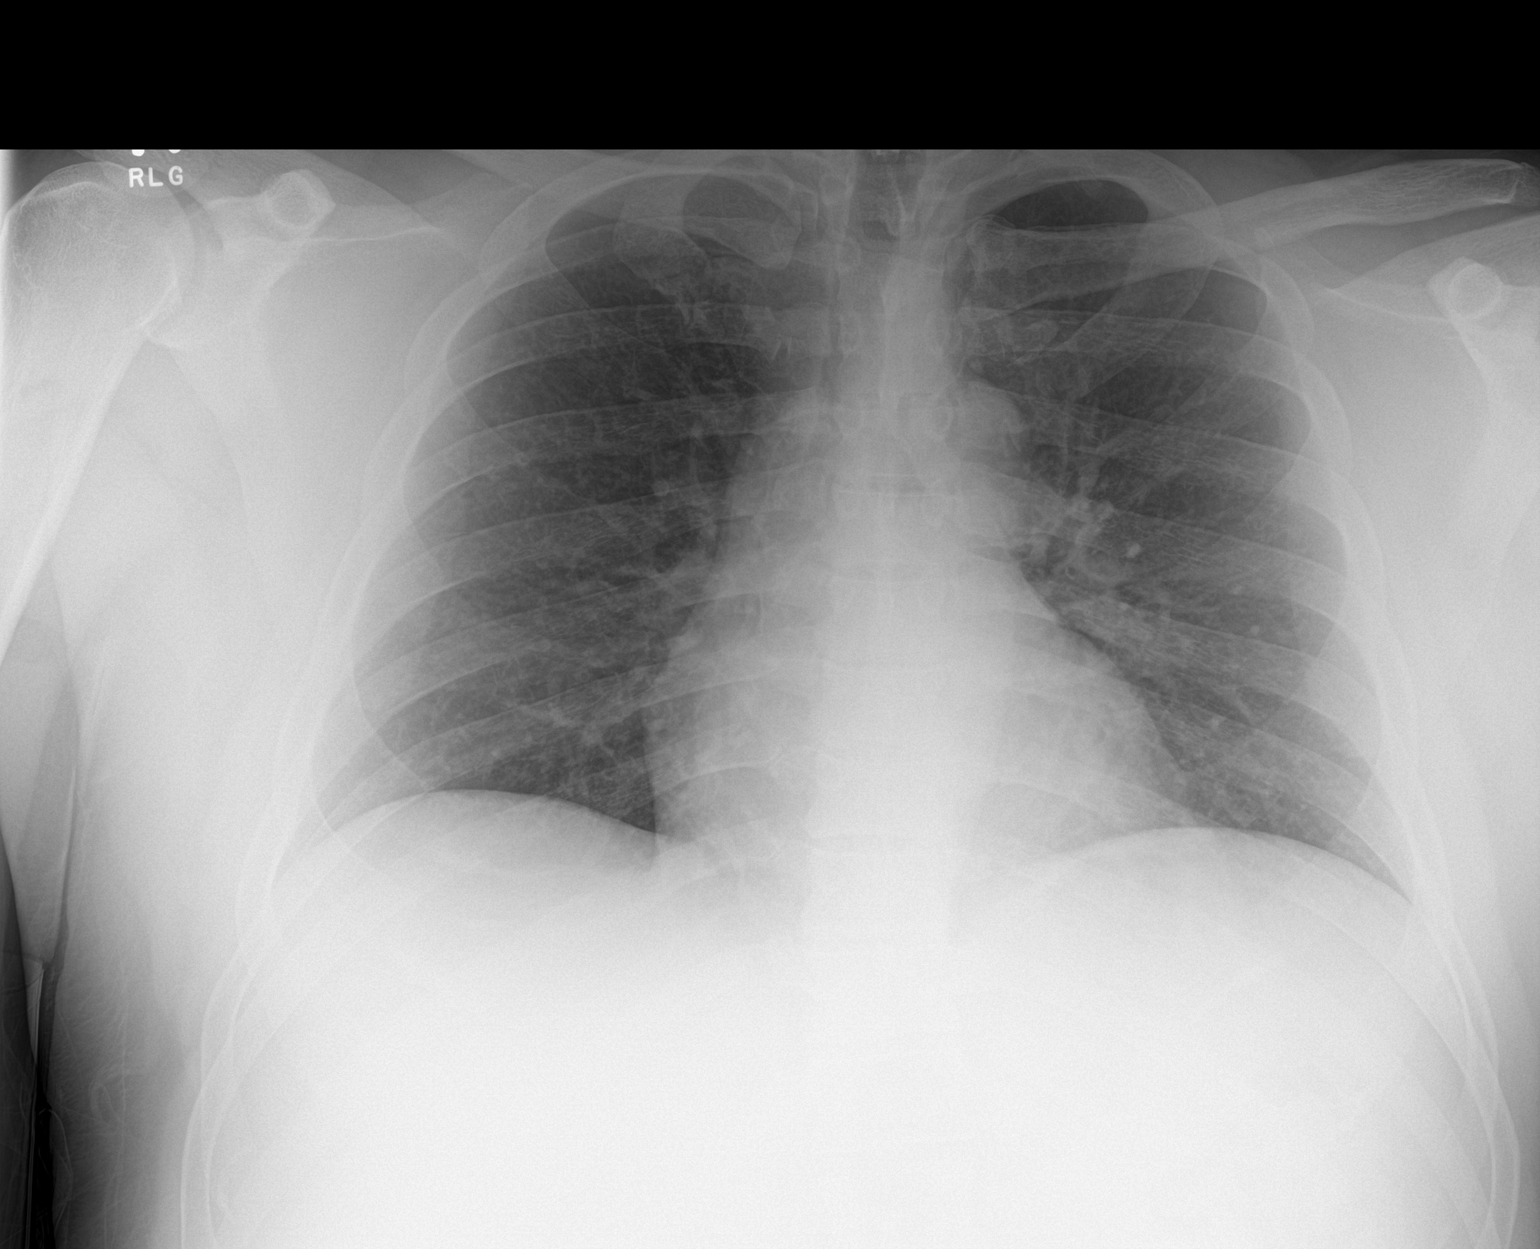

[1 of 1 positions shown; findings below may reference images not displayed]

FINDINGS: The lungs are clear. The heart size and pulmonary vascularity are
normal. No adenopathy. No bone lesions.
IMPRESSION: No edema or consolidation.

## 2020-02-14 ENCOUNTER — Ambulatory Visit: Payer: 59 | Admitting: Critical Care Medicine

## 2020-02-14 ENCOUNTER — Other Ambulatory Visit: Payer: Self-pay

## 2020-02-14 ENCOUNTER — Encounter: Payer: Self-pay | Admitting: Critical Care Medicine

## 2020-02-14 VITALS — BP 150/90 | HR 79 | Ht 70.0 in | Wt 226.0 lb

## 2020-02-14 DIAGNOSIS — R053 Chronic cough: Secondary | ICD-10-CM

## 2020-02-14 DIAGNOSIS — R0600 Dyspnea, unspecified: Secondary | ICD-10-CM | POA: Diagnosis not present

## 2020-02-14 DIAGNOSIS — R05 Cough: Secondary | ICD-10-CM

## 2020-02-14 DIAGNOSIS — J4540 Moderate persistent asthma, uncomplicated: Secondary | ICD-10-CM

## 2020-02-14 DIAGNOSIS — R0609 Other forms of dyspnea: Secondary | ICD-10-CM

## 2020-02-14 DIAGNOSIS — Z8616 Personal history of COVID-19: Secondary | ICD-10-CM

## 2020-02-14 MED ORDER — ALBUTEROL SULFATE HFA 108 (90 BASE) MCG/ACT IN AERS
2.0000 | INHALATION_SPRAY | RESPIRATORY_TRACT | 11 refills | Status: DC | PRN
Start: 2020-02-14 — End: 2020-08-15

## 2020-02-14 MED ORDER — ALBUTEROL SULFATE HFA 108 (90 BASE) MCG/ACT IN AERS: 1.0000 | INHALATION_SPRAY | RESPIRATORY_TRACT | 11 refills | Status: DC | PRN

## 2020-02-14 MED ORDER — FLUTICASONE-SALMETEROL 250-50 MCG/DOSE IN AEPB: 1.0000 | INHALATION_SPRAY | Freq: Two times a day (BID) | RESPIRATORY_TRACT | 11 refills | Status: DC

## 2020-02-14 NOTE — Addendum Note (Signed)
Addended by: [name] on: 02/14/2020 09:47 AM   Modules accepted: Orders

## 2020-02-14 NOTE — Progress Notes (Signed)
Synopsis: Referred in July 2021 for wheezing, cough by Farris Has, MD  Subjective:   PATIENT ID: Stephen Nguyen, Stephen Nguyen  Chief Complaint  Patient presents with  . Consult    Patient had covid in April 2020 and was admitted to the hospital and has had dry cough since then and shortness of breath with exertion. Cough is worse in the morning and sometimes a night.     Stephen Nguyen is a 44 y/o gentleman who presents for follow-up of persistent dyspnea on exertion and dry cough since April 2020 when he was admitted to the hospital with acute hypoxic respiratory failure and symptoms consistent with Covid.  He had 2 negative nasal swabs, also negative respiratory viral panel.  He had fever, myalgias, loss of taste which persisted for 3 months after.  He continues to have shortness of breath that limits his activity to exercise.  He has frequent coughing, worse when short of breath, laughing, during exercise.  Albuterol improves his symptoms, which he has started using prior to working out every day.  His father had asthma, and he has allergies.  No family history of eczema.  He has never smoked or vape.  He takes Zyrtec for his allergies.  He has occasional heartburn which is treated with omeprazole.  He works as a Archivist at [employer].     Past Medical History:  Diagnosis Date  . Hypertension      Family History  Problem Relation Age of Onset  . Cancer Mother        GU  . Asthma Father   . Cancer Father        skin  . Prostate cancer Paternal Grandfather   . Cancer Paternal Grandmother      Past Surgical History:  Procedure Laterality Date  . APPENDECTOMY    . dental implant    . HERNIA REPAIR     childhood  . ROTATOR CUFF REPAIR     Left  . WISDOM TOOTH EXTRACTION      Social History   Socioeconomic History  . Marital status: Married    Spouse name: Not on file  . Number of children: Not on file  . Years  of education: Not on file  . Highest education level: Not on file  Occupational History  . Not on file  Tobacco Use  . Smoking status: Never Smoker  . Smokeless tobacco: Never Used  Vaping Use  . Vaping Use: Never used  Substance and Sexual Activity  . Alcohol use: No  . Drug use: No  . Sexual activity: Yes  Other Topics Concern  . Not on file  Social History Narrative  . Not on file   Social Determinants of Health   Financial Resource Strain:   . Difficulty of Paying Living Expenses:   Food Insecurity:   . Worried About Programme researcher, broadcasting/film/video in the Last Year:   . Barista in the Last Year:   Transportation Needs:   . Freight forwarder (Medical):   Marland Kitchen Lack of Transportation (Non-Medical):   Physical Activity:   . Days of Exercise per Week:   . Minutes of Exercise per Session:   Stress:   . Feeling of Stress :   Social Connections:   . Frequency of Communication with Friends and Family:   . Frequency of Social Gatherings with Friends and Family:   . Attends Religious Services:   .  Active Member of Clubs or Organizations:   . Attends Banker Meetings:   Marland Kitchen Marital Status:   Intimate Partner Violence:   . Fear of Current or Ex-Partner:   . Emotionally Abused:   Marland Kitchen Physically Abused:   . Sexually Abused:      No Known Allergies   Immunization History  Administered Date(s) Administered  . Influenza Inj Mdck Quad Pf 06/20/2018  . Tdap 08/05/2018    Outpatient Medications Prior to Visit  Medication Sig Dispense Refill  . acetaminophen (TYLENOL) 325 MG tablet Take 2 tablets (650 mg total) by mouth every 6 (six) hours as needed for mild pain or fever (or Fever >/= 101).    . metoprolol tartrate (LOPRESSOR) 25 MG tablet Take 25 mg by mouth 2 (two) times daily.    . Multiple Vitamin (ONE-A-DAY MENS PO) Take 1 tablet by mouth daily.    Marland Kitchen omeprazole (PRILOSEC) 40 MG capsule Take 40 mg by mouth daily as needed for heartburn.    . SUMAtriptan  (IMITREX) 50 MG tablet Take 50 mg by mouth every 2 (two) hours as needed for migraine. May repeat in 2 hours if headache persists or recurs.    . verapamil (CALAN-SR) 120 MG CR tablet Take 120 mg by mouth at bedtime.    . vitamin C (ASCORBIC ACID) 500 MG tablet Take 1 tablet (500 mg total) by mouth 2 (two) times daily. 15 tablet 0  . albuterol (PROVENTIL HFA;VENTOLIN HFA) 108 (90 BASE) MCG/ACT inhaler Inhale 1-2 puffs into the lungs every 6 (six) hours as needed for wheezing or shortness of breath. 1 Inhaler 0   No facility-administered medications prior to visit.    Review of Systems  Constitutional: Negative for chills and fever.  HENT: Negative for congestion.   Respiratory: Positive for cough, shortness of breath and wheezing.   Cardiovascular: Negative for chest pain and leg swelling.  Gastrointestinal: Positive for heartburn. Negative for nausea and vomiting.  Skin: Negative for rash.  Neurological: Negative for weakness.  Endo/Heme/Allergies: Positive for environmental allergies.     Objective:   Vitals:   02/14/20 0915  BP: (!) 150/90  Pulse: 79  SpO2: 98%  Weight: 226 lb (102.5 kg)  Height: 5\' 10"  (1.778 m)   98% on   RA BMI Readings from Last 3 Encounters:  02/14/20 32.43 kg/m  11/25/18 31.85 kg/m  08/05/18 33.19 kg/m   Wt Readings from Last 3 Encounters:  02/14/20 226 lb (102.5 kg)  11/25/18 222 lb (100.7 kg)  08/05/18 228 lb (103.4 kg)    Physical Exam Vitals reviewed.  Constitutional:      General: He is not in acute distress.    Appearance: Normal appearance. He is not ill-appearing.  HENT:     Head: Normocephalic and atraumatic.  Eyes:     General: No scleral icterus. Cardiovascular:     Rate and Rhythm: Normal rate and regular rhythm.     Heart sounds: No murmur heard.   Pulmonary:     Comments: Frequent dry coughing, especially during deep inhalation.  No conversational dyspnea.  Clear to auscultation bilaterally. Abdominal:     General:  There is no distension.  Musculoskeletal:        General: No swelling or deformity.     Cervical back: Neck supple.  Skin:    General: Skin is warm and dry.     Findings: No rash.  Neurological:     General: No focal deficit present.  Mental Status: He is alert.     Coordination: Coordination normal.  Psychiatric:        Mood and Affect: Mood normal.        Behavior: Behavior normal.      CBC    Component Value Date/Time   WBC 10.2 11/26/2018 0442   RBC 4.52 11/26/2018 0442   HGB 14.0 11/26/2018 0442   HCT 42.2 11/26/2018 0442   PLT 212 11/26/2018 0442   MCV 93.4 11/26/2018 0442   MCH 31.0 11/26/2018 0442   MCHC 33.2 11/26/2018 0442   RDW 12.3 11/26/2018 0442   LYMPHSABS 0.9 11/25/2018 0931   MONOABS 0.7 11/25/2018 0931   EOSABS 0.1 11/25/2018 0931   BASOSABS 0.1 11/25/2018 0931    CHEMISTRY No results for input(s): NA, K, CL, CO2, GLUCOSE, BUN, CREATININE, CALCIUM, MG, PHOS in the last 168 hours. CrCl cannot be calculated (Patient's most recent lab result is older than the maximum 21 days allowed.).   Chest Imaging- films reviewed: CXR, 1 view 11/25/2018-airway thickening, no opacities  Pulmonary Functions Testing Results: No flowsheet data found.      Assessment & Plan:     ICD-10-CM   1. DOE (dyspnea on exertion)  R06.00 Pulmonary function test  2. Chronic cough  R05   3. Moderate persistent asthma without complication  J45.40   4. History of COVID-19  Z86.16      Dyspnea on exertion after likely COVID-19 infection; based on symptoms of chronic cough, dyspnea on exertion, and reported wheezing this is likely moderate persistent asthma -Start Advair twice daily.  Instructed to rinse his mouth after every use. -Continue albuterol every 4 hours as needed -PFTs -If symptoms fail to fully resolve, will plan to get HRCT chest  RTC in 2 months after PFTs.   Current Outpatient Medications:  .  acetaminophen (TYLENOL) 325 MG tablet, Take 2 tablets (650  mg total) by mouth every 6 (six) hours as needed for mild pain or fever (or Fever >/= 101)., Disp: , Rfl:  .  albuterol (VENTOLIN HFA) 108 (90 Base) MCG/ACT inhaler, Inhale 1-2 puffs into the lungs every 4 (four) hours as needed for wheezing or shortness of breath., Disp: 18 g, Rfl: 11 .  metoprolol tartrate (LOPRESSOR) 25 MG tablet, Take 25 mg by mouth 2 (two) times daily., Disp: , Rfl:  .  Multiple Vitamin (ONE-A-DAY MENS PO), Take 1 tablet by mouth daily., Disp: , Rfl:  .  omeprazole (PRILOSEC) 40 MG capsule, Take 40 mg by mouth daily as needed for heartburn., Disp: , Rfl:  .  SUMAtriptan (IMITREX) 50 MG tablet, Take 50 mg by mouth every 2 (two) hours as needed for migraine. May repeat in 2 hours if headache persists or recurs., Disp: , Rfl:  .  verapamil (CALAN-SR) 120 MG CR tablet, Take 120 mg by mouth at bedtime., Disp: , Rfl:  .  vitamin C (ASCORBIC ACID) 500 MG tablet, Take 1 tablet (500 mg total) by mouth 2 (two) times daily., Disp: 15 tablet, Rfl: 0 .  Fluticasone-Salmeterol (ADVAIR DISKUS) 250-50 MCG/DOSE AEPB, Inhale 1 puff into the lungs 2 (two) times daily., Disp: 180 each, Rfl: 11    [name], DO [facility] 02/14/2020 9:38 AM

## 2020-02-14 NOTE — Patient Instructions (Addendum)
Thank you for visiting Dr. Chestine Spore at [facility]. We recommend the following: Orders Placed This Encounter  Procedures  . Pulmonary function test   Orders Placed This Encounter  Procedures  . Pulmonary function test    Standing Status:   Future    Standing Expiration Date:   02/13/2021    Order Specific Question:   Where should this test be performed?    Answer:   [facility]    Order Specific Question:   Full PFT: includes the following: basic spirometry, spirometry pre & post bronchodilator, diffusion capacity (DLCO), lung volumes    Answer:   Full PFT    Meds ordered this encounter  Medications  . Fluticasone-Salmeterol (ADVAIR DISKUS) 250-50 MCG/DOSE AEPB    Sig: Inhale 1 puff into the lungs 2 (two) times daily.    Dispense:  180 each    Refill:  11  . albuterol (VENTOLIN HFA) 108 (90 Base) MCG/ACT inhaler    Sig: Inhale 1-2 puffs into the lungs every 4 (four) hours as needed for wheezing or shortness of breath.    Dispense:  18 g    Refill:  11    Return in about 2 months (around 04/16/2020).  after PFTs.   Please do your part to reduce the spread of COVID-19.

## 2020-04-18 ENCOUNTER — Ambulatory Visit: Payer: 59 | Admitting: Pulmonary Disease

## 2020-05-17 ENCOUNTER — Encounter: Payer: Self-pay | Admitting: Pulmonary Disease

## 2020-05-17 ENCOUNTER — Other Ambulatory Visit: Payer: Self-pay

## 2020-05-17 ENCOUNTER — Ambulatory Visit (INDEPENDENT_AMBULATORY_CARE_PROVIDER_SITE_OTHER): Payer: 59 | Admitting: Critical Care Medicine

## 2020-05-17 ENCOUNTER — Ambulatory Visit: Payer: 59 | Admitting: Pulmonary Disease

## 2020-05-17 VITALS — BP 130/82 | HR 74 | Temp 97.3°F | Ht 72.0 in | Wt 230.0 lb

## 2020-05-17 DIAGNOSIS — R0602 Shortness of breath: Secondary | ICD-10-CM

## 2020-05-17 DIAGNOSIS — Z8616 Personal history of COVID-19: Secondary | ICD-10-CM | POA: Insufficient documentation

## 2020-05-17 DIAGNOSIS — R0600 Dyspnea, unspecified: Secondary | ICD-10-CM | POA: Diagnosis not present

## 2020-05-17 DIAGNOSIS — Z0000 Encounter for general adult medical examination without abnormal findings: Secondary | ICD-10-CM | POA: Insufficient documentation

## 2020-05-17 DIAGNOSIS — R0609 Other forms of dyspnea: Secondary | ICD-10-CM

## 2020-05-17 LAB — PULMONARY FUNCTION TEST
DL/VA % pred: 136 %
DL/VA: 6.16 ml/min/mmHg/L
DLCO cor % pred: 100 %
DLCO cor: 30.26 ml/min/mmHg
DLCO unc % pred: 100 %
DLCO unc: 30.26 ml/min/mmHg
FEF 25-75 Post: 4.66 L/sec
FEF 25-75 Pre: 3.35 L/sec
FEF2575-%Change-Post: 39 %
FEF2575-%Pred-Post: 126 %
FEF2575-%Pred-Pre: 91 %
FEV1-%Change-Post: 14 %
FEV1-%Pred-Post: 81 %
FEV1-%Pred-Pre: 70 %
FEV1-Post: 3.29 L
FEV1-Pre: 2.86 L
FEV1FVC-%Change-Post: 4 %
FEV1FVC-%Pred-Pre: 108 %
FEV6-%Change-Post: 9 %
FEV6-%Pred-Post: 73 %
FEV6-%Pred-Pre: 67 %
FEV6-Post: 3.68 L
FEV6-Pre: 3.37 L
FEV6FVC-%Pred-Post: 103 %
FEV6FVC-%Pred-Pre: 103 %
FVC-%Change-Post: 9 %
FVC-%Pred-Post: 71 %
FVC-%Pred-Pre: 65 %
FVC-Post: 3.69 L
FVC-Pre: 3.37 L
Post FEV1/FVC ratio: 89 %
Post FEV6/FVC ratio: 100 %
Pre FEV1/FVC ratio: 85 %
Pre FEV6/FVC Ratio: 100 %
RV % pred: 76 %
RV: 1.49 L
TLC % pred: 76 %
TLC: 5.32 L

## 2020-05-17 NOTE — Progress Notes (Signed)
@Patient  ID: , male    DOB: Oct 11, 1975, 44 y.o.   MRN: [id number]  Chief Complaint  Patient presents with  . Follow-up    DOE with pfts, still dealing with SOB    Referring provider: [id number], MD  HPI:  44 year old male never smoker followed in our office for dyspnea on exertion history of COVID-19 infection  PMH: Hypertension, COVID-19 Smoker/ Smoking History: Never smoker Maintenance: Advair 250 Pt of: Dr. 49  05/18/2020  - Visit   44 year old never smoker initially referred to our office for dyspnea on exertion patient was seen by Dr. 49 on 02/14/2020.  Plan of care at that time was for him to obtain pulmonary function testing.  He was status post COVID-19 infection.  He was instructed to start Advair twice daily.  If symptoms were not completely resolving then we will need to obtain a high-resolution CT chest.  Patient shortness of breath started when he contracted COVID-19 in April/2020. Patient was hospitalized due to COVID-19 in April/2020.  The discharge summary is listed below:  Recommendations for primary care physician for things to follow:  -Please follow on the final results of an COVID-19 test which has been sent to [facility] -Patient has been given [facility] Department of Health infection prevention recommendation  Admission Diagnosis  Cough [R05] Hypoxia [R09.02] Suspected Covid-19 Virus Infection [R68.89] Acute respiratory failure (HCC) [J96.00]   Discharge Diagnosis  Cough [R05] Hypoxia [R09.02] Suspected Covid-19 Virus Infection [R68.89] Acute respiratory failure (HCC) [J96.00]    Active Problems:   Benign essential HTN   Leukocytosis   Suspected Covid-19 Virus Infection      History of present illness and  Hospital Course:     Kindly see H&P for history of present illness and admission details, please review complete Labs, Consult reports and Test reports for all details in brief  HPI  from the history and  physical done on the day of admission 11/25/2018  11/27/2018 Stephen VELEZ-ABADIAis a 42 y.o.malewith medical history ofHTN who presents for symptoms that started last night. He developed a sore throat and a cough last night and this has worsened today. He is having chest pain, abdominal discomfort and body aches as well. His cough is productive of a small amount of yellow sputum. No runny nose, earache or fullness in ears, no watery eyes. No vomiting or diarrhea. No other complaints. 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   ED Course:temp 100.2, HR in low 100s, pulse os 89 % when walking in the room, RR goes up to 30s when walking. WBC 11, CXR clear.  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 He developed a sore throat and a cough last night and this has worsened today. He is having chest pain, abdominal discomfort and body aches as well. His cough is productive of a small amount of yellow sputum. No runny nose, earache or fullness in ears, no watery eyes. No vomiting or diarrhea. No other complaints. 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   ED Course:temp 100.2, HR in low 100s, pulse os 89 % when walking in the room, RR goes up to 30s when walking. WBC 11, CXR clear.  Suspected COVID-19 infection -Patient presents with dyspnea, cough, fever, loss of taste, shortness of breath, briefly any nausea, diarrhea and sore throat, and symptoms is highly suspicious  for COVID-19 infection, even though he had negative COVID-19 test by PCR, up to 30% can be false positive, so his admitting diagnoses likely related to COVID-19 infection, patient in the absence of other source of his fever or infection, as his rapid test was negative, influenza test is negative, and respiratory PCR panel is negative as well, I have discussed with ID, they recommend repeat  COVID-19 testing by [facility], which has been sent, results are pending at time of discharge, have discussed with the patient, even if that test is negative, ankle findings almost diagnostic of COVID-19, so hewasinstructed to his pulse ox daily to continue with vitamin C, zinc, Tylenol for fever, keep well-hydrated, and to follow [place] infection prevention recommendation for COVID-19, this morning patient saturating 100% on room air without any oxygen requirement, he was instructed to by pulse ox and monitor his saturation daily.  Hypertension -Continue with home meds  GERD -Continue with PPI  Discharge Condition:  stable Discussed with wife via phone   Patient reports that after last being seen in starting Advair 250 it is felt that his breathing and cough has improved significantly.  He is also had less wheezing.  He did have one episode which he describes as an asthma attack done 05/15/2020.  Refill very short of breath and he could not catch his breath.  He had to use his rescue inhaler.  He is only had 1 of these events.  The Advair had been working so well patient had been able to return to work as well as return to exercising at the gym.  He does have to use his rescue inhaler prior to exercising.  Patient's pulmonary function testing results are listed below:  05/17/2020-pulmonary function test-FVC 3.37 (65% addicted), postbronchodilator ratio 89, postbronchodilator FEV1 3.29 (81% addicted), positive bronchodilator response in FEV1, TLC 5.32 (76% predicted), DLCO 30.26 (100% predicted)    We will discuss and review them today.  Questionaires / Pulmonary Flowsheets:   ACT:  No flowsheet data found.  MMRC: No flowsheet data found.  Epworth:  No flowsheet data found.  Tests:   05/17/2020-pulmonary function test-FVC 3.37 (65% addicted), postbronchodilator ratio 89, postbronchodilator FEV1 3.29 (81% addicted), positive bronchodilator response in FEV1, TLC 5.32 (76%  predicted), DLCO 30.26 (100% predicted)  11/25/2018-chest x-ray-no edema or consolidation, lungs are clear  FENO:  No results found for: NITRICOXIDE  PFT: PFT Results Latest Ref Rng & Units 05/17/2020  FVC-Pre L 3.37  FVC-Predicted Pre % 65  FVC-Post L 3.69  FVC-Predicted Post % 71  Pre FEV1/FVC % % 85  Post FEV1/FCV % % 89  FEV1-Pre L 2.86  FEV1-Predicted Pre % 70  FEV1-Post L 3.29  DLCO uncorrected ml/min/mmHg 30.26  DLCO UNC% % 100  DLCO corrected ml/min/mmHg 30.26  DLCO COR %Predicted % 100  DLVA Predicted % 136  TLC L 5.32  TLC % Predicted % 76  RV % Predicted % 76    WALK:  No flowsheet data found.  Imaging: No results found.  Lab Results:  CBC    Component Value Date/Time   WBC 10.2 11/26/2018 0442   RBC 4.52 11/26/2018 0442   HGB 14.0 11/26/2018 0442   HCT 42.2 11/26/2018 0442   PLT 212 11/26/2018 0442   MCV 93.4 11/26/2018 0442   MCH 31.0 11/26/2018 0442   MCHC 33.2 11/26/2018 0442   RDW 12.3 11/26/2018 0442   LYMPHSABS 0.9 11/25/2018 0931   MONOABS 0.7 11/25/2018 0931   EOSABS 0.1 11/25/2018 0931  BASOSABS 0.1 11/25/2018 0931    BMET    Component Value Date/Time   NA 137 11/26/2018 0442   K 3.2 (L) 11/26/2018 0442   CL 105 11/26/2018 0442   CO2 24 11/26/2018 0442   GLUCOSE 110 (H) 11/26/2018 0442   BUN 15 11/26/2018 0442   CREATININE 1.20 11/26/2018 0442   CALCIUM 8.2 (L) 11/26/2018 0442   GFRNONAA >60 11/26/2018 0442   GFRAA >60 11/26/2018 0442    BNP No results found for: BNP  ProBNP No results found for: PROBNP  Specialty Problems      Pulmonary Problems   Shortness of breath      No Known Allergies  Immunization History  Administered Date(s) Administered  . Influenza Inj Mdck Quad Pf 06/20/2018  . PFIZER SARS-COV-2 Vaccination 09/08/2019, 10/06/2019  . Tdap 08/05/2018    Past Medical History:  Diagnosis Date  . Hypertension     Tobacco History: Social History   Tobacco Use  Smoking Status Never Smoker   Smokeless Tobacco Never Used   Counseling given: Yes   Continue to not smoke  Outpatient Encounter Medications as of 05/17/2020  Medication Sig  . acetaminophen (TYLENOL) 325 MG tablet Take 2 tablets (650 mg total) by mouth every 6 (six) hours as needed for mild pain or fever (or Fever >/= 101).  Marland Kitchen albuterol (VENTOLIN HFA) 108 (90 Base) MCG/ACT inhaler Inhale 2 puffs into the lungs every 4 (four) hours as needed for wheezing or shortness of breath.  . Fluticasone-Salmeterol (ADVAIR DISKUS) 250-50 MCG/DOSE AEPB Inhale 1 puff into the lungs 2 (two) times daily.  . metoprolol tartrate (LOPRESSOR) 25 MG tablet Take 25 mg by mouth 2 (two) times daily.  . Multiple Vitamin (ONE-A-DAY MENS PO) Take 1 tablet by mouth daily.  Marland Kitchen omeprazole (PRILOSEC) 40 MG capsule Take 40 mg by mouth daily as needed for heartburn.  . SUMAtriptan (IMITREX) 50 MG tablet Take 50 mg by mouth every 2 (two) hours as needed for migraine. May repeat in 2 hours if headache persists or recurs.  . verapamil (CALAN-SR) 120 MG CR tablet Take 120 mg by mouth at bedtime.  . vitamin C (ASCORBIC ACID) 500 MG tablet Take 1 tablet (500 mg total) by mouth 2 (two) times daily.   No facility-administered encounter medications on file as of 05/17/2020.     Review of Systems  Review of Systems  Constitutional: Negative for activity change, chills, fatigue, fever and unexpected weight change.  HENT: Negative for postnasal drip, rhinorrhea, sinus pressure, sinus pain and sore throat.   Eyes: Negative.   Respiratory: Positive for cough (dry cough ) and shortness of breath (improved ). Negative for wheezing.   Cardiovascular: Negative for chest pain and palpitations.  Gastrointestinal: Negative for constipation, diarrhea, nausea and vomiting.  Endocrine: Negative.   Genitourinary: Negative.   Musculoskeletal: Negative.   Skin: Negative.   Neurological: Negative for dizziness and headaches.  Psychiatric/Behavioral: Negative.  Negative  for dysphoric mood. The patient is not nervous/anxious.   All other systems reviewed and are negative.    Physical Exam  BP 130/82 (BP Location: Left Arm, Cuff Size: Normal)   Pulse 74   Temp (!) 97.3 F (36.3 C) (Oral)   Ht 6' (1.829 m)   Wt 230 lb (104.3 kg)   SpO2 97%   BMI 31.19 kg/m   Wt Readings from Last 5 Encounters:  05/17/20 230 lb (104.3 kg)  02/14/20 226 lb (102.5 kg)  11/25/18 222 lb (100.7 kg)  08/05/18  228 lb (103.4 kg)  08/01/12 210 lb (95.3 kg)    BMI Readings from Last 5 Encounters:  05/17/20 31.19 kg/m  02/14/20 32.43 kg/m  11/25/18 31.85 kg/m  08/05/18 33.19 kg/m  08/01/12 30.13 kg/m     Physical Exam Vitals and nursing note reviewed.  Constitutional:      General: He is not in acute distress.    Appearance: Normal appearance. He is normal weight.  HENT:     Head: Normocephalic and atraumatic.     Right Ear: Hearing and external ear normal.     Left Ear: Hearing and external ear normal.     Nose: Nose normal. No mucosal edema or rhinorrhea.     Right Turbinates: Not enlarged.     Left Turbinates: Not enlarged.     Mouth/Throat:     Mouth: Mucous membranes are dry.     Pharynx: Oropharynx is clear. No oropharyngeal exudate.  Eyes:     Pupils: Pupils are equal, round, and reactive to light.  Cardiovascular:     Rate and Rhythm: Normal rate and regular rhythm.     Pulses: Normal pulses.     Heart sounds: Normal heart sounds. No murmur heard.   Pulmonary:     Effort: Pulmonary effort is normal.     Breath sounds: Normal breath sounds. No decreased breath sounds, wheezing or rales.  Musculoskeletal:     Cervical back: Normal range of motion.     Right lower leg: No edema.     Left lower leg: No edema.  Lymphadenopathy:     Cervical: No cervical adenopathy.  Skin:    General: Skin is warm and dry.     Capillary Refill: Capillary refill takes less than 2 seconds.     Findings: No erythema or rash.  Neurological:     General: No  focal deficit present.     Mental Status: He is alert and oriented to person, place, and time.     Motor: No weakness.     Coordination: Coordination normal.     Gait: Gait is intact. Gait normal.  Psychiatric:        Mood and Affect: Mood normal.        Behavior: Behavior normal. Behavior is cooperative.        Thought Content: Thought content normal.        Judgment: Judgment normal.       Assessment & Plan:   History of COVID-19   History of COVID-19 infection April/2020 requiring hospitalization, not mechanical ventilation  clear chest x-ray in April/2020 Asthma-like presentation of dyspnea with clinical improvement with Advair 250 in July/2021 Very mild restriction on pulmonary function testing today, reviewed with patient  Discussion: Given patient's symptomatic improvement with Advair 250 as well as clear chest x-ray in April/2020 believe it is reasonable for us to continue to clinically monitor the patient.  I did review with patient that he does have mild restriction but a normal diffusion capacity.  If symptoms do worsen clinically then we would need to consider high-resolution CT scan to further evaluate.  Plan: Continue Advair 250 Patient to investigate other ICS/LABA options that are available with his insurance as he does not like the dry powder of Advair 250 Patient to contact our office if shortness of breath, wheezing, or if breathing worsens We will have patient follow-up and establish care with Dr. Isaiah SergeMannam in 2 months and 30-minute time slot  Shortness of breath Plan: Continue Advair 250 Continue to clinically  monitor shortness of breath  Healthcare maintenance Plan: Recommend seasonal flu vaccine    Return in about 2 months (around 07/17/2020), or if symptoms worsen or fail to improve, for 30 MINUTE SLOT, Follow up with Dr. Isaiah Serge.   Coral Ceo, NP 05/18/2020   This appointment required 32 minutes of patient care (this includes precharting, chart  review, review of results, face-to-face care, etc.).

## 2020-05-17 NOTE — Patient Instructions (Addendum)
You were seen today by Coral Ceo, NP  for:   1. Shortness of breath 2. History of COVID-19  Continue Advair 250  Check with your insurance company and see what other ICS/LABA inhalers are covered such as Symbicort 160 or Advair HFA.  If these are covered similarly to your Advair 250 we could switch to this that way it will have to worry about the dry powder inhaler/taste  Continue to work on improving your overall physical activity and exercising  We will hold off on ordering a high-resolution CT of your chest given the fact that you have a stable chest x-ray in April/2020 and you felt significant clinical relief when starting Advair after July/2021.  If your shortness of breath worsens or changes please let our office know and we can order a high-resolution CT chest to further evaluate  3. Healthcare maintenance  Obtain your seasonal flu vaccine   Follow Up:    Return in about 2 months (around 07/17/2020), or if symptoms worsen or fail to improve, for 30 MINUTE SLOT, Follow up with Dr. Isaiah Serge.   Notification of test results are managed in the following manner: If there are  any recommendations or changes to the  plan of care discussed in office today,  we will contact you and let you know what they are. If you do not hear from [place], then your results are normal and you can view them through your  MyChart account , or a letter will be sent to you. Thank you again for trusting [place] with your care  - Thank you, [facility]    It is flu season:   >>> Best ways to protect herself from the flu: Receive the yearly flu vaccine, practice good hand hygiene washing with soap and also using hand sanitizer when available, eat a nutritious meals, get adequate rest, hydrate appropriately       Please contact the office if your symptoms worsen or you have concerns that you are not improving.   Thank you for choosing  [facility] for your healthcare, and for allowing [place] to  partner with you on your healthcare journey. I am thankful to be able to provide care to you today.   Elisha Headland FNP-C

## 2020-05-17 NOTE — Progress Notes (Signed)
Full PFT performed today. °

## 2020-05-17 NOTE — Patient Instructions (Signed)
Full PFT performed today. °

## 2020-05-18 NOTE — Assessment & Plan Note (Addendum)
History of COVID-19 infection April/2020 requiring hospitalization, not mechanical ventilation  clear chest x-ray in April/2020 Asthma-like presentation of dyspnea with clinical improvement with Advair 250 in July/2021 Very mild restriction on pulmonary function testing today, reviewed with patient  Discussion: Given patient's symptomatic improvement with Advair 250 as well as clear chest x-ray in April/2020 believe it is reasonable for [place] to continue to clinically monitor the patient.  I did review with patient that he does have mild restriction but a normal diffusion capacity.  If symptoms do worsen clinically then we would need to consider high-resolution CT scan to further evaluate.  Plan: Continue Advair 250 Patient to investigate other ICS/LABA options that are available with his insurance as he does not like the dry powder of Advair 250 Patient to contact our office if shortness of breath, wheezing, or if breathing worsens We will have patient follow-up and establish care with Dr. Isaiah Serge in 2 months and 30-minute time slot

## 2020-05-18 NOTE — Assessment & Plan Note (Signed)
Plan: Recommend seasonal flu vaccine 

## 2020-05-18 NOTE — Assessment & Plan Note (Signed)
Plan: Continue Advair 250 Continue to clinically monitor shortness of breath

## 2020-05-25 NOTE — Progress Notes (Signed)
Agree, thanks!  [name], DO 05/25/20 3:25 PM [facility]

## 2020-07-20 ENCOUNTER — Ambulatory Visit: Payer: 59 | Admitting: Pulmonary Disease

## 2020-08-15 ENCOUNTER — Encounter: Payer: Self-pay | Admitting: Pulmonary Disease

## 2020-08-15 ENCOUNTER — Other Ambulatory Visit: Payer: Self-pay

## 2020-08-15 ENCOUNTER — Ambulatory Visit: Payer: 59 | Admitting: Pulmonary Disease

## 2020-08-15 VITALS — BP 126/68 | HR 81 | Temp 97.3°F | Ht 70.0 in | Wt 241.2 lb

## 2020-08-15 DIAGNOSIS — J4530 Mild persistent asthma, uncomplicated: Secondary | ICD-10-CM | POA: Diagnosis not present

## 2020-08-15 MED ORDER — FLUTICASONE-SALMETEROL 250-50 MCG/DOSE IN AEPB: 1.0000 | INHALATION_SPRAY | Freq: Two times a day (BID) | RESPIRATORY_TRACT | 3 refills | Status: DC

## 2020-08-15 MED ORDER — ALBUTEROL SULFATE HFA 108 (90 BASE) MCG/ACT IN AERS: 2.0000 | INHALATION_SPRAY | RESPIRATORY_TRACT | 11 refills | Status: DC | PRN

## 2020-08-15 NOTE — Patient Instructions (Signed)
I am glad you are starting to feel well with your breathing Continue your inhaler.  We will send in a renewal for this  Follow-up in 1 year.

## 2020-08-15 NOTE — Progress Notes (Signed)
LORENZA WINKLEMAN    [id number]    December 04, 1975  Primary Care Physician:Morrow, Clifton Custard, MD  Referring Physician: Farris Has, MD [address] [place],  [place] [address]  Chief complaint: Follow-up for asthma, post COVID-83  HPI: 45 year old with history of hypertension Hospitalized with presumed COVID-19 in April 2020.  He had mild symptoms and was admitted for a day.  Did not require supplemental oxygen and was treated with supportive care.  Interestingly he had 2 Covid tests as outpatient and inpatient inpatient which were negative.  Post discharge he continues to feel unwell with symptoms of dyspnea, wheezing.  Evaluated by pulmonary who felt he had presentation consistent with reactive airway disease, asthma.  Started on Advair with improvement in symptoms. He feels well now and is using the Advair inhaler only intermittently.  He has history of hypertension, seasonal allergies and acid reflux for which she is on a PPI. Tested for sleep apnea 3 years ago and was told it was negative.   Pets: Dog Occupation: Emergency planning/management officer Exposures: No mold, hot tub, Jacuzzi.  No feather pillows or comforters Smoking history: Non-smoker Travel history: Originally from [place]  Immigrated in 2004.  No significant recent travel Relevant family history: No significant family history of lung disease   Outpatient Encounter Medications as of 08/15/2020  Medication Sig  . acetaminophen (TYLENOL) 325 MG tablet Take 2 tablets (650 mg total) by mouth every 6 (six) hours as needed for mild pain or fever (or Fever >/= 101).  Marland Kitchen albuterol (VENTOLIN HFA) 108 (90 Base) MCG/ACT inhaler Inhale 2 puffs into the lungs every 4 (four) hours as needed for wheezing or shortness of breath.  . Fluticasone-Salmeterol (ADVAIR DISKUS) 250-50 MCG/DOSE AEPB Inhale 1 puff into the lungs 2 (two) times daily.  . metoprolol tartrate (LOPRESSOR) 25 MG tablet Take 25 mg by mouth 2 (two) times  daily.  . Multiple Vitamin (ONE-A-DAY MENS PO) Take 1 tablet by mouth daily.  Marland Kitchen omeprazole (PRILOSEC) 40 MG capsule Take 40 mg by mouth daily as needed for heartburn.  . SUMAtriptan (IMITREX) 50 MG tablet Take 50 mg by mouth every 2 (two) hours as needed for migraine. May repeat in 2 hours if headache persists or recurs.  . verapamil (CALAN-SR) 120 MG CR tablet Take 120 mg by mouth at bedtime.  . vitamin C (ASCORBIC ACID) 500 MG tablet Take 1 tablet (500 mg total) by mouth 2 (two) times daily.   No facility-administered encounter medications on file as of 08/15/2020.    Allergies as of 08/15/2020  . (No Known Allergies)    Past Medical History:  Diagnosis Date  . Hypertension     Past Surgical History:  Procedure Laterality Date  . APPENDECTOMY    . dental implant    . HERNIA REPAIR     childhood  . ROTATOR CUFF REPAIR     Left  . WISDOM TOOTH EXTRACTION      Family History  Problem Relation Age of Onset  . Cancer Mother        GU  . Asthma Father   . Cancer Father        skin  . Prostate cancer Paternal Grandfather   . Cancer Paternal Grandmother     Social History   Socioeconomic History  . Marital status: Married    Spouse name: Not on file  . Number of children: Not on file  . Years of education: Not on  file  . Highest education level: Not on file  Occupational History  . Not on file  Tobacco Use  . Smoking status: Never Smoker  . Smokeless tobacco: Never Used  Vaping Use  . Vaping Use: Never used  Substance and Sexual Activity  . Alcohol use: No  . Drug use: No  . Sexual activity: Yes  Other Topics Concern  . Not on file  Social History Narrative  . Not on file   Social Determinants of Health   Financial Resource Strain: Not on file  Food Insecurity: Not on file  Transportation Needs: Not on file  Physical Activity: Not on file  Stress: Not on file  Social Connections: Not on file  Intimate Partner Violence: Not on file    Review of  systems: Review of Systems  Constitutional: Negative for fever and chills.  HENT: Negative.   Eyes: Negative for blurred vision.  Respiratory: as per HPI  Cardiovascular: Negative for chest pain and palpitations.  Gastrointestinal: Negative for vomiting, diarrhea, blood per rectum. Genitourinary: Negative for dysuria, urgency, frequency and hematuria.  Musculoskeletal: Negative for myalgias, back pain and joint pain.  Skin: Negative for itching and rash.  Neurological: Negative for dizziness, tremors, focal weakness, seizures and loss of consciousness.  Endo/Heme/Allergies: Negative for environmental allergies.  Psychiatric/Behavioral: Negative for depression, suicidal ideas and hallucinations.  All other systems reviewed and are negative.  Physical Exam: Blood pressure 126/68, pulse 81, temperature (!) 97.3 F (36.3 C), temperature source Skin, height 5\' 10"  (1.778 m), weight 241 lb 3.2 oz (109.4 kg), SpO2 98 %. Gen:      No acute distress HEENT:  EOMI, sclera anicteric Neck:     No masses; no thyromegaly Lungs:    Clear to auscultation bilaterally; normal respiratory effort CV:         Regular rate and rhythm; no murmurs Abd:      + bowel sounds; soft, non-tender; no palpable masses, no distension Ext:    No edema; adequate peripheral perfusion Skin:      Warm and dry; no rash Neuro: alert and oriented x 3 Psych: normal mood and affect  Data Reviewed: Imaging: Chest x-ray 11/25/2018-clear lungs with no acute process.  I have reviewed the images personally.  PFTs: 05/20/2020 FVC 3.69 [71%], FEV1 3.29 [81%], F/F 89, TLC 5.32 [76%], DLCO 30.26 [90%] Mild restriction with reactive airways  Labs:  Assessment:  Mild persistent asthma On Advair and albuterol but is using it only intermittently.  He feels that his breathing is doing well and would like to avoid excessive use of medication  He does have a history of presumed Covid infection but no evidence of post Covid  pneumonitis or fibrosis.  Chest x-ray was clear and PFTs with mild restriction which may be related to body habitus.  We do not need a CT scan to follow-up as he is doing well.  Plan/Recommendations: Continue inhalers as needed  Follow-up in 1 year.  If he is doing well then he can be discharged from clinic.  07/20/2020 MD [facility] 08/15/2020, 9:40 AM  CC: 10/13/2020, MD

## 2020-08-20 ENCOUNTER — Other Ambulatory Visit: Payer: Self-pay

## 2020-08-20 ENCOUNTER — Other Ambulatory Visit: Payer: 59

## 2020-08-20 DIAGNOSIS — Z20822 Contact with and (suspected) exposure to covid-19: Secondary | ICD-10-CM

## 2020-08-22 LAB — SARS-COV-2, NAA 2 DAY TAT

## 2020-08-22 LAB — NOVEL CORONAVIRUS, NAA: SARS-CoV-2, NAA: NOT DETECTED

## 2020-11-04 ENCOUNTER — Other Ambulatory Visit: Payer: Self-pay

## 2020-11-04 ENCOUNTER — Emergency Department (HOSPITAL_BASED_OUTPATIENT_CLINIC_OR_DEPARTMENT_OTHER)
Admission: EM | Admit: 2020-11-04 | Discharge: 2020-11-04 | Disposition: A | Discharge status: Home / Self Care | Payer: 59 | Attending: Emergency Medicine | Admitting: Emergency Medicine

## 2020-11-04 ENCOUNTER — Encounter (HOSPITAL_BASED_OUTPATIENT_CLINIC_OR_DEPARTMENT_OTHER): Payer: Self-pay

## 2020-11-04 DIAGNOSIS — W458XXA Other foreign body or object entering through skin, initial encounter: Secondary | ICD-10-CM

## 2020-11-04 DIAGNOSIS — I10 Essential (primary) hypertension: Secondary | ICD-10-CM | POA: Diagnosis not present

## 2020-11-04 DIAGNOSIS — Z8616 Personal history of COVID-19: Secondary | ICD-10-CM | POA: Insufficient documentation

## 2020-11-04 DIAGNOSIS — Z79899 Other long term (current) drug therapy: Secondary | ICD-10-CM | POA: Insufficient documentation

## 2020-11-04 DIAGNOSIS — S8992XA Unspecified injury of left lower leg, initial encounter: Secondary | ICD-10-CM | POA: Diagnosis present

## 2020-11-04 DIAGNOSIS — S80852A Superficial foreign body, left lower leg, initial encounter: Secondary | ICD-10-CM | POA: Insufficient documentation

## 2020-11-04 DIAGNOSIS — W268XXA Contact with other sharp object(s), not elsewhere classified, initial encounter: Secondary | ICD-10-CM | POA: Insufficient documentation

## 2020-11-04 HISTORY — DX: Migraine, unspecified, not intractable, without status migrainosus: G43.909

## 2020-11-04 MED ORDER — LIDOCAINE HCL (PF) 1 % IJ SOLN
10.0000 mL | Freq: Once | INTRAMUSCULAR | Status: AC
  Administered 2020-11-04: 10 mL
  Filled 2020-11-04: qty 10

## 2020-11-04 NOTE — Discharge Instructions (Signed)
Keep wound clean and dry.  If you notice significant redness, swelling, purulence, come back to ER for reassessment.

## 2020-11-04 NOTE — ED Provider Notes (Signed)
[facility] EMERGENCY DEPT Provider Note   CSN: [id number] Arrival date & time: 11/04/20  1625     History Chief Complaint  Patient presents with  . Leg Injury    Stephen Nguyen is a 45 y.o. male.  Presents to ER with concern for leg injury.  Fishing, accidentally got a trouble hook caught in his left anterior leg.  Pain at site, no other injuries.  Denies medical problems.  States his last tetanus was within the past couple years.  HPI     Past Medical History:  Diagnosis Date  . Hypertension   . Migraines     Patient Active Problem List   Diagnosis Date Noted  . Shortness of breath 05/17/2020  . History of COVID-19 05/17/2020  . Healthcare maintenance 05/17/2020  . Benign essential HTN 11/25/2018  . Leukocytosis 11/25/2018    Past Surgical History:  Procedure Laterality Date  . APPENDECTOMY    . dental implant    . HERNIA REPAIR     childhood  . ROTATOR CUFF REPAIR     Left  . WISDOM TOOTH EXTRACTION         Family History  Problem Relation Age of Onset  . Cancer Mother        GU  . Asthma Father   . Cancer Father        skin  . Prostate cancer Paternal Grandfather   . Cancer Paternal Grandmother     Social History   Tobacco Use  . Smoking status: Never Smoker  . Smokeless tobacco: Never Used  Vaping Use  . Vaping Use: Never used  Substance Use Topics  . Alcohol use: No  . Drug use: No    Home Medications Prior to Admission medications   Medication Sig Start Date End Date Taking? Authorizing Provider  acetaminophen (TYLENOL) 325 MG tablet Take 2 tablets (650 mg total) by mouth every 6 (six) hours as needed for mild pain or fever (or Fever >/= 101). 11/26/18   Elgergawy, Leana Roe, MD  albuterol (VENTOLIN HFA) 108 (90 Base) MCG/ACT inhaler Inhale 2 puffs into the lungs every 4 (four) hours as needed for wheezing or shortness of breath. 08/15/20   Mannam, Colbert Coyer, MD  Fluticasone-Salmeterol (ADVAIR DISKUS) 250-50 MCG/DOSE  AEPB Inhale 1 puff into the lungs 2 (two) times daily. 08/15/20   Mannam, Colbert Coyer, MD  metoprolol tartrate (LOPRESSOR) 25 MG tablet Take 25 mg by mouth 2 (two) times daily.    [provider]  Multiple Vitamin (ONE-A-DAY MENS PO) Take 1 tablet by mouth daily.    [provider]  omeprazole (PRILOSEC) 40 MG capsule Take 40 mg by mouth daily as needed for heartburn. 11/03/18   [provider]  SUMAtriptan (IMITREX) 50 MG tablet Take 50 mg by mouth every 2 (two) hours as needed for migraine. May repeat in 2 hours if headache persists or recurs.    [provider]  verapamil (CALAN-SR) 120 MG CR tablet Take 120 mg by mouth at bedtime.    [provider]  vitamin C (ASCORBIC ACID) 500 MG tablet Take 1 tablet (500 mg total) by mouth 2 (two) times daily. 11/26/18   Elgergawy, Leana Roe, MD    Allergies    Patient has no known allergies.  Review of Systems   Review of Systems  Musculoskeletal: Positive for arthralgias.  All other systems reviewed and are negative.   Physical Exam Updated Vital Signs BP (!) 144/87 (BP Location: Right Arm)  Pulse 65   Temp 97.8 F (36.6 C) (Oral)   Resp 18   Ht 5\' 10"  (1.778 m)   Wt 102.1 kg   SpO2 98%   BMI 32.28 kg/m   Physical Exam Vitals and nursing note reviewed.  Constitutional:      Appearance: He is well-developed.  HENT:     Head: Normocephalic and atraumatic.  Eyes:     Conjunctiva/sclera: Conjunctivae normal.  Cardiovascular:     Rate and Rhythm: Normal rate and regular rhythm.     Heart sounds: No murmur heard.   Pulmonary:     Effort: Pulmonary effort is normal. No respiratory distress.  Musculoskeletal:     Cervical back: Neck supple.     Comments: LLE: Fishing hook, one of the hooks is caught in the anterior surface of his mid lower leg  Skin:    General: Skin is warm and dry.  Neurological:     Mental Status: He is alert.  Psychiatric:        Mood and Affect: Mood normal.         Behavior: Behavior normal.     ED Results / Procedures / Treatments   Labs (all labs ordered are listed, but only abnormal results are displayed) Labs Reviewed - No data to display  EKG None  Radiology No results found.  Procedures .Foreign Body Removal  Date/Time: 11/04/2020 6:16 PM Performed by: 11/06/2020, MD Authorized by: Milagros Loll, MD  Consent: Verbal consent obtained. Consent given by: patient Patient identity confirmed: verbally with patient Body area: skin General location: lower extremity Location details: left lower leg Anesthesia: local infiltration  Anesthesia: Local Anesthetic: lidocaine 1% without epinephrine Anesthetic total: 4 mL  Sedation: Patient sedated: no  Localization method: visualized Tendon involvement: none Depth: subcutaneous Complexity: simple 1 objects recovered. Post-procedure assessment: foreign body removed Comments: Prepped area with Betadine, injected a few milliliters of 1% lidocaine for local anesthetic. Cut hook from remainder of fishing lure. Then pushed hook through skin and removed it. Pt tolerated well.      Medications Ordered in ED Medications  lidocaine (PF) (XYLOCAINE) 1 % injection 10 mL (10 mLs Infiltration Given 11/04/20 1647)    ED Course  I have reviewed the triage vital signs and the nursing notes.  Pertinent labs & imaging results that were available during my care of the patient were reviewed by me and considered in my medical decision making (see chart for details).    MDM Rules/Calculators/A&P                          45 year old male presents to ER after fishing lure was caught on his left leg.  Removed without significant difficulty using local anesthetic and wire cutter.    After the discussed management above, the patient was determined to be safe for discharge.  The patient was in agreement with this plan and all questions regarding their care were answered.  ED return  precautions were discussed and the patient will return to the ED with any significant worsening of condition.   Final Clinical Impression(s) / ED Diagnoses Final diagnoses:  Fishing hook foreign body, initial encounter    Rx / DC Orders ED Discharge Orders    None       47, MD 11/04/20 1820

## 2020-11-04 NOTE — ED Triage Notes (Signed)
Pt reports on the boat and hooked his LLE.

## 2021-05-31 ENCOUNTER — Ambulatory Visit: Payer: 59 | Attending: Internal Medicine

## 2021-05-31 ENCOUNTER — Other Ambulatory Visit (HOSPITAL_BASED_OUTPATIENT_CLINIC_OR_DEPARTMENT_OTHER): Payer: Self-pay

## 2021-05-31 ENCOUNTER — Other Ambulatory Visit: Payer: Self-pay

## 2021-05-31 DIAGNOSIS — Z23 Encounter for immunization: Secondary | ICD-10-CM

## 2021-05-31 MED ORDER — FLUARIX QUADRIVALENT 0.5 ML IM SUSY
PREFILLED_SYRINGE | INTRAMUSCULAR | 0 refills | Status: DC
  Filled 2021-05-31: qty 0.5, 1d supply, fill #0

## 2021-05-31 MED ORDER — PFIZER COVID-19 VAC BIVALENT 30 MCG/0.3ML IM SUSP
INTRAMUSCULAR | 0 refills | Status: DC
  Filled 2021-05-31: qty 0.3, 1d supply, fill #0

## 2021-05-31 NOTE — Progress Notes (Signed)
   Covid-19 Vaccination Clinic  Name:  Stephen Nguyen    MRN: [id number] DOB: 17-Apr-1976  05/31/2021  Mr. Stephen Nguyen was observed post Covid-19 immunization for 15 minutes without incident. He was provided with Vaccine Information Sheet and instruction to access the V-Safe system.   Mr. Stephen Nguyen was instructed to call 911 with any severe reactions post vaccine: Difficulty breathing  Swelling of face and throat  A fast heartbeat  A bad rash all over body  Dizziness and weakness   Immunizations Administered     Name Date Dose VIS Date Route   Pfizer Covid-19 Vaccine Bivalent Booster 05/31/2021  9:47 AM 0.3 mL 04/10/2021 Intramuscular   Manufacturer: [employer], [employer]   Lot: [id number]   NDC: [phone]

## 2021-08-12 ENCOUNTER — Emergency Department (HOSPITAL_BASED_OUTPATIENT_CLINIC_OR_DEPARTMENT_OTHER): Payer: 59

## 2021-08-12 ENCOUNTER — Emergency Department (HOSPITAL_BASED_OUTPATIENT_CLINIC_OR_DEPARTMENT_OTHER)
Admission: EM | Admit: 2021-08-12 | Discharge: 2021-08-12 | Disposition: A | Discharge status: Home / Self Care | Payer: 59 | Attending: Emergency Medicine | Admitting: Emergency Medicine

## 2021-08-12 ENCOUNTER — Other Ambulatory Visit: Payer: Self-pay

## 2021-08-12 ENCOUNTER — Encounter (HOSPITAL_BASED_OUTPATIENT_CLINIC_OR_DEPARTMENT_OTHER): Payer: Self-pay | Admitting: Emergency Medicine

## 2021-08-12 DIAGNOSIS — K529 Noninfective gastroenteritis and colitis, unspecified: Secondary | ICD-10-CM | POA: Diagnosis not present

## 2021-08-12 DIAGNOSIS — R1011 Right upper quadrant pain: Secondary | ICD-10-CM

## 2021-08-12 DIAGNOSIS — R1084 Generalized abdominal pain: Secondary | ICD-10-CM

## 2021-08-12 DIAGNOSIS — R1013 Epigastric pain: Secondary | ICD-10-CM | POA: Diagnosis present

## 2021-08-12 HISTORY — DX: Gastro-esophageal reflux disease without esophagitis: K21.9

## 2021-08-12 LAB — CBC
HCT: 48 % (ref 39.0–52.0)
Hemoglobin: 16.8 g/dL (ref 13.0–17.0)
MCH: 30.9 pg (ref 26.0–34.0)
MCHC: 35 g/dL (ref 30.0–36.0)
MCV: 88.2 fL (ref 80.0–100.0)
Platelets: 294 10*3/uL (ref 150–400)
RBC: 5.44 MIL/uL (ref 4.22–5.81)
RDW: 12.2 % (ref 11.5–15.5)
WBC: 8.8 10*3/uL (ref 4.0–10.5)
nRBC: 0 % (ref 0.0–0.2)

## 2021-08-12 LAB — COMPREHENSIVE METABOLIC PANEL
ALT: 39 U/L (ref 0–44)
AST: 28 U/L (ref 15–41)
Albumin: 4.6 g/dL (ref 3.5–5.0)
Alkaline Phosphatase: 67 U/L (ref 38–126)
Anion gap: 10 (ref 5–15)
BUN: 11 mg/dL (ref 6–20)
CO2: 29 mmol/L (ref 22–32)
Calcium: 9.6 mg/dL (ref 8.9–10.3)
Chloride: 98 mmol/L (ref 98–111)
Creatinine, Ser: 1.16 mg/dL (ref 0.61–1.24)
GFR, Estimated: >60 mL/min (ref >60)
Glucose, Bld: 110 mg/dL — ABNORMAL HIGH (ref 70–99)
Potassium: 3.6 mmol/L (ref 3.5–5.1)
Sodium: 137 mmol/L (ref 135–145)
Total Bilirubin: 1.2 mg/dL (ref 0.3–1.2)
Total Protein: 8 g/dL (ref 6.5–8.1)

## 2021-08-12 LAB — URINALYSIS, ROUTINE W REFLEX MICROSCOPIC
Bilirubin Urine: NEGATIVE
Glucose, UA: NEGATIVE mg/dL
Hgb urine dipstick: NEGATIVE
Ketones, ur: NEGATIVE mg/dL
Leukocytes,Ua: NEGATIVE
Nitrite: NEGATIVE
Specific Gravity, Urine: 1.021 (ref 1.005–1.030)
pH: 6 (ref 5.0–8.0)

## 2021-08-12 LAB — LIPASE, BLOOD: Lipase: 26 U/L (ref 11–51)

## 2021-08-12 MED ORDER — LACTATED RINGERS IV BOLUS
1000.0000 mL | Freq: Once | INTRAVENOUS | Status: AC
  Administered 2021-08-12: 1000 mL via INTRAVENOUS

## 2021-08-12 MED ORDER — IOHEXOL 300 MG/ML  SOLN
85.0000 mL | Freq: Once | INTRAMUSCULAR | Status: AC | PRN
  Administered 2021-08-12: 85 mL via INTRAVENOUS

## 2021-08-12 MED ORDER — FENTANYL CITRATE PF 50 MCG/ML IJ SOSY
50.0000 ug | PREFILLED_SYRINGE | Freq: Once | INTRAMUSCULAR | Status: AC
  Administered 2021-08-12: 50 ug via INTRAVENOUS
  Filled 2021-08-12: qty 1

## 2021-08-12 MED ORDER — ALUM & MAG HYDROXIDE-SIMETH 200-200-20 MG/5ML PO SUSP
30.0000 mL | Freq: Once | ORAL | Status: AC
  Administered 2021-08-12: 30 mL via ORAL
  Filled 2021-08-12: qty 30

## 2021-08-12 MED ORDER — ONDANSETRON HCL 4 MG/2ML IJ SOLN
4.0000 mg | Freq: Once | INTRAMUSCULAR | Status: AC
  Administered 2021-08-12: 4 mg via INTRAVENOUS
  Filled 2021-08-12: qty 2

## 2021-08-12 MED ORDER — LIDOCAINE VISCOUS HCL 2 % MT SOLN
15.0000 mL | Freq: Once | OROMUCOSAL | Status: AC
  Administered 2021-08-12: 15 mL via ORAL
  Filled 2021-08-12: qty 15

## 2021-08-12 MED ORDER — ONDANSETRON 4 MG PO TBDP: 4.0000 mg | ORAL_TABLET | Freq: Three times a day (TID) | ORAL | 0 refills | Status: DC | PRN

## 2021-08-12 NOTE — ED Notes (Signed)
RN provided AVS using Teachback Method. Patient verbalizes understanding of Discharge Instructions. Opportunity for Questioning and Answers were provided by RN. Patient Discharged from ED ambulatory to Home with Family. ? ?

## 2021-08-12 NOTE — ED Provider Notes (Signed)
[facility] EMERGENCY DEPT Provider Note   CSN: [id number] Arrival date & time: 08/12/21  [id number]     History  Chief Complaint  Patient presents with   Abdominal Pain    Stephen Nguyen is a 46 y.o. male.   Abdominal Pain Associated symptoms: diarrhea and nausea   Associated symptoms: no vomiting    46 year old male presenting to the emergency department with a chief complaint of epigastric abdominal pain.  The patient states that the pain has been present intermittently for the past 3 days.  He endorses worsening pain with eating and drinking.  He states that he has had diarrhea during that time as well.  Symptoms came on after eating suspect food.  He denies any fevers or chills.  His last bowel movement was this morning and was loose and watery.  His epigastric abdominal pain radiates mildly to the right upper quadrant.  Home Medications Prior to Admission medications   Medication Sig Start Date End Date Taking? Authorizing Provider  acetaminophen (TYLENOL) 325 MG tablet Take 2 tablets (650 mg total) by mouth every 6 (six) hours as needed for mild pain or fever (or Fever >/= 101). 11/26/18   Elgergawy, Silver Huguenin, MD  albuterol (VENTOLIN HFA) 108 (90 Base) MCG/ACT inhaler Inhale 2 puffs into the lungs every 4 (four) hours as needed for wheezing or shortness of breath. 08/15/20   Mannam, Hart Robinsons, MD  COVID-19 mRNA bivalent vaccine, Pfizer, (PFIZER COVID-19 VAC BIVALENT) injection Inject into the muscle. 05/31/21   Carlyle Basques, MD  Fluticasone-Salmeterol (ADVAIR DISKUS) 250-50 MCG/DOSE AEPB Inhale 1 puff into the lungs 2 (two) times daily. 08/15/20   Mannam, Hart Robinsons, MD  influenza vac split quadrivalent PF (FLUARIX QUADRIVALENT) 0.5 ML injection Inject into the muscle. 05/31/21     metoprolol tartrate (LOPRESSOR) 25 MG tablet Take 25 mg by mouth 2 (two) times daily.    [provider]  Multiple Vitamin (ONE-A-DAY MENS PO) Take 1 tablet by mouth daily.     [provider]  omeprazole (PRILOSEC) 40 MG capsule Take 40 mg by mouth daily as needed for heartburn. 11/03/18   [provider]  SUMAtriptan (IMITREX) 50 MG tablet Take 50 mg by mouth every 2 (two) hours as needed for migraine. May repeat in 2 hours if headache persists or recurs.    [provider]  verapamil (CALAN-SR) 120 MG CR tablet Take 120 mg by mouth at bedtime.    [provider]  vitamin C (ASCORBIC ACID) 500 MG tablet Take 1 tablet (500 mg total) by mouth 2 (two) times daily. 11/26/18   Elgergawy, Silver Huguenin, MD      Allergies    Patient has no known allergies.    Review of Systems   Review of Systems  Gastrointestinal:  Positive for abdominal pain, diarrhea and nausea. Negative for vomiting.  All other systems reviewed and are negative.  Physical Exam Updated Vital Signs BP 133/90 (BP Location: Right Arm)    Pulse 83    Temp 98 F (36.7 C)    Resp 12    Ht 5\' 9"  (1.753 m)    Wt 101.2 kg    SpO2 98%    BMI 32.93 kg/m  Physical Exam Vitals and nursing note reviewed.  Constitutional:      General: He is not in acute distress. HENT:     Head: Normocephalic and atraumatic.  Eyes:     Conjunctiva/sclera: Conjunctivae normal.     Pupils: Pupils are equal, round,  and reactive to light.  Cardiovascular:     Rate and Rhythm: Normal rate and regular rhythm.  Pulmonary:     Effort: Pulmonary effort is normal. No respiratory distress.  Abdominal:     General: There is no distension.     Tenderness: There is abdominal tenderness in the right upper quadrant and epigastric area. There is no guarding or rebound.  Musculoskeletal:        General: No deformity or signs of injury.     Cervical back: Neck supple.  Skin:    Findings: No lesion or rash.  Neurological:     General: No focal deficit present.     Mental Status: He is alert. Mental status is at baseline.    ED Results / Procedures / Treatments   Labs (all labs ordered are listed,  but only abnormal results are displayed) Labs Reviewed  COMPREHENSIVE METABOLIC PANEL - Abnormal; Notable for the following components:      Result Value   Glucose, Bld 110 (*)    All other components within normal limits  URINALYSIS, ROUTINE W REFLEX MICROSCOPIC - Abnormal; Notable for the following components:   Protein, ur TRACE (*)    All other components within normal limits  LIPASE, BLOOD  CBC    EKG None  Radiology CT ABDOMEN PELVIS W CONTRAST  Result Date: 08/12/2021 CLINICAL DATA:  Abdominal pain EXAM: CT ABDOMEN AND PELVIS WITH CONTRAST TECHNIQUE: Multidetector CT imaging of the abdomen and pelvis was performed using the standard protocol following bolus administration of intravenous contrast. CONTRAST:  106mL OMNIPAQUE IOHEXOL 300 MG/ML  SOLN COMPARISON:  Ultrasound abdomen done earlier today FINDINGS: Lower chest: Unremarkable. Hepatobiliary: Liver measures 18.1 cm. There is fatty infiltration. There is no dilation of bile ducts. Possible sludge is seen in the lumen of gallbladder. There is no wall thickening in gallbladder. Pancreas: No focal abnormality is seen. Spleen: Spleen measures 13 cm. Adrenals/Urinary Tract: Adrenals are unremarkable. There is no hydronephrosis. There are no renal or ureteral stones. Urinary bladder is unremarkable. Stomach/Bowel: Stomach is unremarkable. Small bowel loops are not dilated. Appendix is not seen. There is no pericecal inflammation. There is mild diffuse wall thickening in ascending and transverse colon. There is no loculated pericolic fluid collection. Scattered diverticula are seen in colon. Vascular/Lymphatic: Unremarkable. Reproductive: Unremarkable. Other: Left inguinal hernia containing fat is seen. Small umbilical hernia containing fat is seen. There is no ascites or pneumoperitoneum. Musculoskeletal: Unremarkable. IMPRESSION: There is abnormal wall thickening in the ascending and transverse colon centered at hepatic flexure suggesting  inflammatory or infectious colitis. There is no loculated pericolic fluid collection. Few diverticula are seen in the colon without signs of focal acute diverticulitis. There is no evidence of intestinal obstruction or pneumoperitoneum. There is no hydronephrosis. Enlarged fatty liver.  Enlarged spleen. Other findings as described in the body of the report. Electronically Signed   By: Elmer Picker M.D.   On: 08/12/2021 12:57   US Abdomen Limited RUQ (LIVER/GB)  Result Date: 08/12/2021 CLINICAL DATA:  Pain right upper quadrant EXAM: ULTRASOUND ABDOMEN LIMITED RIGHT UPPER QUADRANT COMPARISON:  None. FINDINGS: Gallbladder: No gallstones or wall thickening visualized. No sonographic Murphy sign noted by sonographer. Common bile duct: Diameter: 5 mm Liver: There is increased echogenicity in the liver. No focal abnormality is seen. Portal vein is patent on color Doppler imaging with normal direction of blood flow towards the liver. Other: None. IMPRESSION: No sonographic abnormality is seen in the gallbladder.  Fatty liver. Electronically Signed  By: Elmer Picker M.D.   On: 08/12/2021 11:29    Procedures Procedures    Medications Ordered in ED Medications  alum & mag hydroxide-simeth (MAALOX/MYLANTA) 200-200-20 MG/5ML suspension 30 mL (30 mLs Oral Given 08/12/21 1040)    And  lidocaine (XYLOCAINE) 2 % viscous mouth solution 15 mL (15 mLs Oral Given 08/12/21 1040)  lactated ringers bolus 1,000 mL (0 mLs Intravenous Stopped 08/12/21 1227)  ondansetron (ZOFRAN) injection 4 mg (4 mg Intravenous Given 08/12/21 1058)  fentaNYL (SUBLIMAZE) injection 50 mcg (50 mcg Intravenous Given 08/12/21 1057)  fentaNYL (SUBLIMAZE) injection 50 mcg (50 mcg Intravenous Given 08/12/21 1244)  iohexol (OMNIPAQUE) 300 MG/ML solution 85 mL (85 mLs Intravenous Contrast Given 08/12/21 1229)    ED Course/ Medical Decision Making/ A&P                           Medical Decision Making  46 year old male presenting to the  emergency department with a chief complaint of epigastric abdominal pain.  The patient states that the pain has been present intermittently for the past 3 days.  He endorses worsening pain with eating and drinking.  He states that he has had diarrhea during that time as well.  Symptoms came on after eating suspect food.  He denies any fevers or chills.  His last bowel movement was this morning and was loose and watery.  His epigastric abdominal pain radiates mildly to the right upper quadrant.  On arrival, the patient was afebrile, hemodynamically stable, mildly hypertensive, saturating well on room air.  Physical exam significant for right upper quadrant tenderness and epigastric tenderness to palpation.  Symptoms are consistent with potential colitis, additional differential consideration includes cholelithiasis/cholecystitis, pancreatitis, small bowel obstruction, diverticulitis, less likely appendicitis, nephrolithiasis, pyelonephritis.  IV access was obtained and the patient was administered an IV fluid bolus, IV Zofran, IV fentanyl and was also p.o. challenged with viscous lidocaine and Maalox.  Initial right upper quadrant ultrasound was performed given the patient's right upper quadrant tenderness on exam and epigastric tenderness.  His ultrasound was negative for acute abnormalities.  The patient's laboratory work-up was significant for negative lipase, normal CBC, CMP that was generally unremarkable, urinalysis without evidence of UTI.  The patient had persistent pain on p.o. challenge and persistent tenderness to palpation, so a CT scan of the abdomen pelvis with contrast was ordered and the patient was administered an additional round of IV fentanyl.  The patient CT scan revealed abnormal wall thickening in the ascending and transverse colon suggesting inflammatory or infectious colitis.  No loculated pericolic fluid collection.  Some diverticula in the colon without evidence of diverticulitis.   The patient has no left lower quadrant tenderness to palpation.  No evidence of obstruction.  Fatty liver noted with some sludge in the lumen of the gallbladder with no gallbladder wall thickening.  Low suspicion for cholecystitis at this time based on the patient's work-up thus far.  No evidence of biliary obstruction.  No evidence for pancreatitis or small bowel obstruction on imaging or labs.  Symptoms are most likely consistent with a colitis.  The patient is tolerating oral intake.  He was advised continued supportive care and symptomatic management outpatient.  Overall stable for discharge.   Final Clinical Impression(s) / ED Diagnoses Final diagnoses:  RUQ pain  Generalized abdominal pain  Colitis    Rx / DC Orders ED Discharge Orders          Ordered  ondansetron (ZOFRAN-ODT) 4 MG disintegrating tablet  Every 8 hours PRN,   Status:  Discontinued        08/12/21 1304              Regan Lemming, MD 08/12/21 2219

## 2021-08-12 NOTE — ED Notes (Signed)
Patient transported to CT at this Time. ?

## 2021-08-12 NOTE — Discharge Instructions (Addendum)
You were evaluated in the Emergency Department and after careful evaluation, we did not find any emergent condition requiring admission or further testing in the hospital.  Your exam/testing today was overall reassuring.  Your laboratory work-up was very reassuring and your CT scan did not reveal evidence of a bowel obstruction, ultrasound did not reveal evidence of gallstone disease/gallbladder disease.  Your CT scan did reveal evidence of a colitis which is likely infectious in nature.  This should run its course over the next few days.  Recommend Tylenol and ibuprofen for pain control and continue to attempt to push oral fluids to maintain hydration.  Consider an electrolyte solution for hydration.  If completely unable to tolerate oral intake, consider returning to the emergency department for admission for IV fluids.  Please return to the Emergency Department if you experience any worsening of your condition.  Thank you for allowing [place] to be a part of your care.

## 2021-08-12 NOTE — ED Notes (Signed)
Patient returned from CT

## 2021-08-12 NOTE — ED Notes (Signed)
Patient transported to US at this Time. °

## 2021-08-12 NOTE — ED Notes (Signed)
Patient toelrated PO Fluids well but began having ABD Pain related to Same. MD made aware.

## 2021-08-12 NOTE — ED Triage Notes (Signed)
Pt arrives to ED with c/o abdominal pain. Pain is located epigastric region. This started x3 days ago. Pt reports the pain is constant ache, pain becomes sharp when pt eats/drinks. Associated symptoms diarrhea.

## 2022-01-04 IMAGING — US US ABDOMEN LIMITED
1 series · 14 of 25 positions shown · non-contrast
Comparison: None.

CLINICAL DATA: Pain right upper quadrant

EXAM:
ULTRASOUND ABDOMEN LIMITED RIGHT UPPER QUADRANT

[Series 1: us abdomen limited ruq (liver/gb) · 14 of 37 slices shown]
[im 1/37]
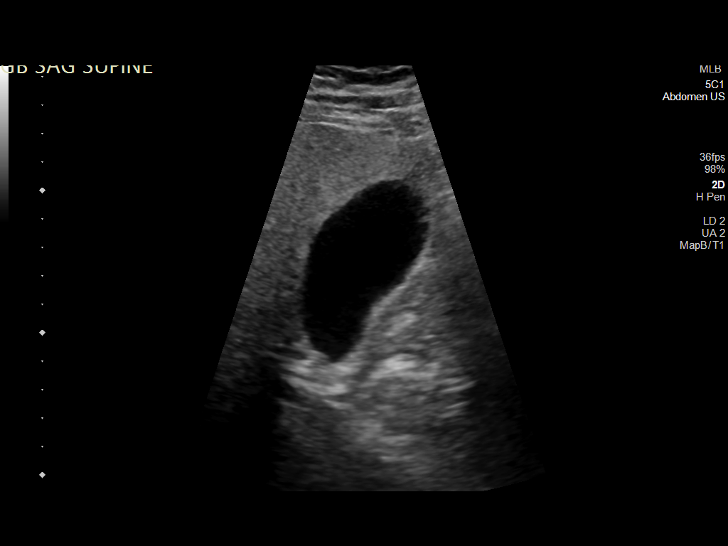
[im 4/37]
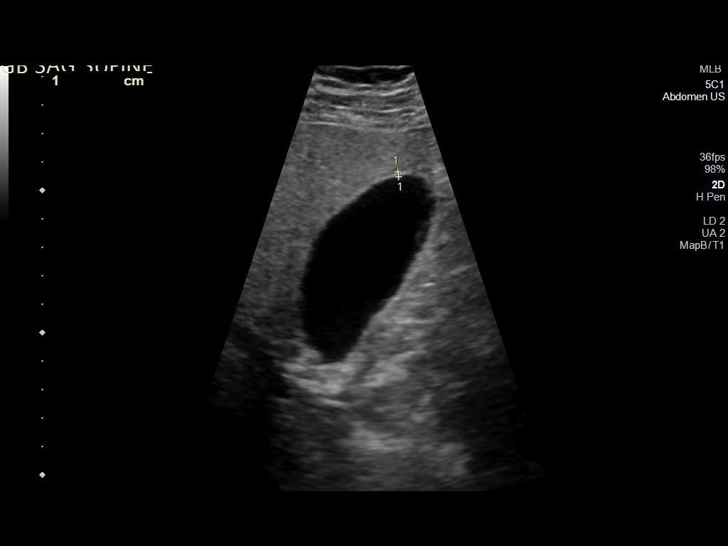
[im 7/37]
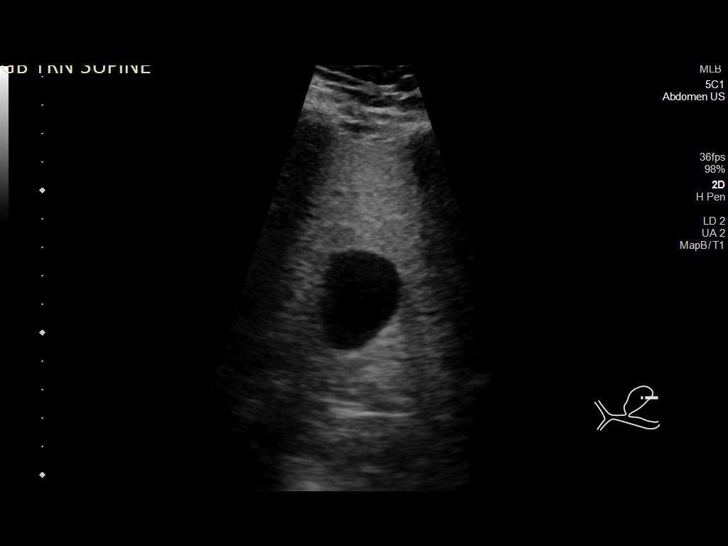
[im 10/37]
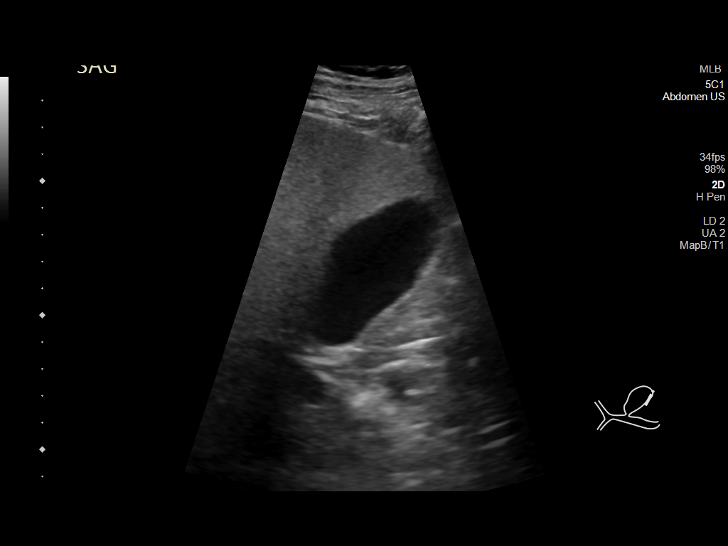
[im 13/37]
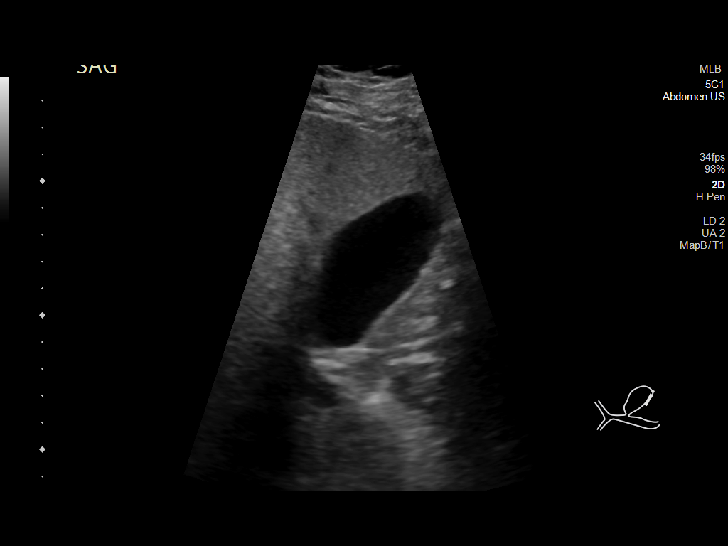
[im 14/37]
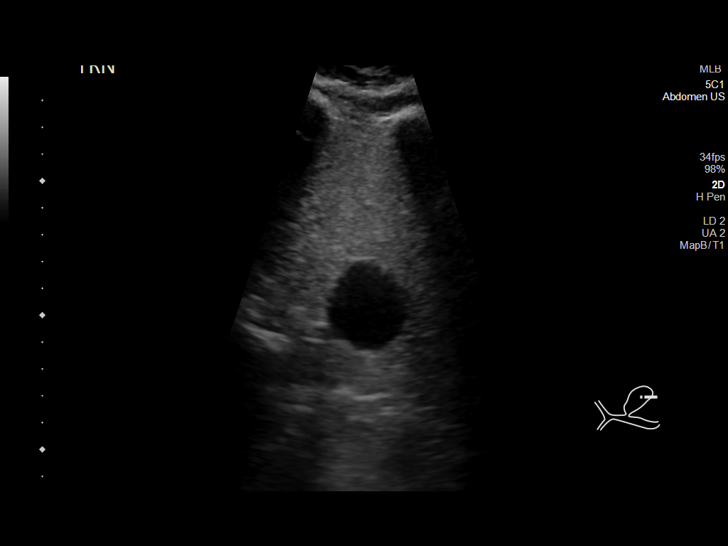
[im 17/37]
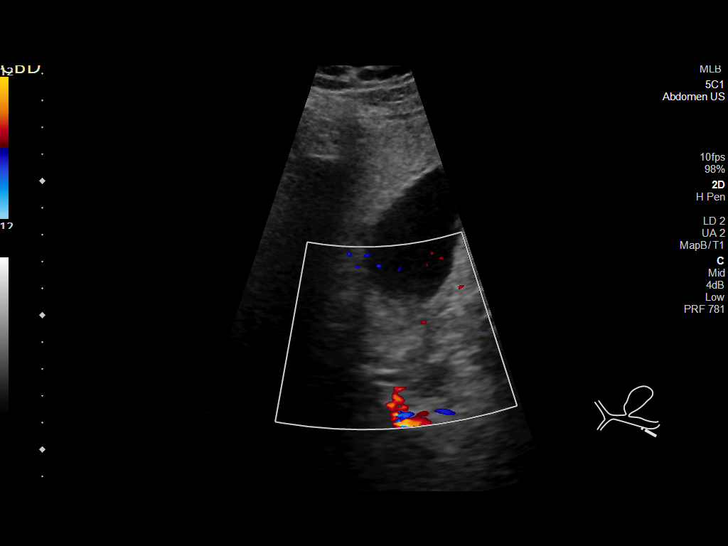
[im 20/37]
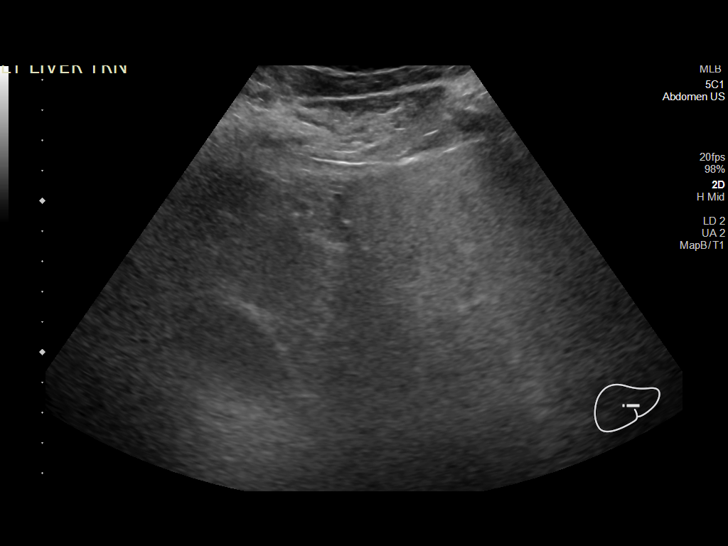
[im 23/37]
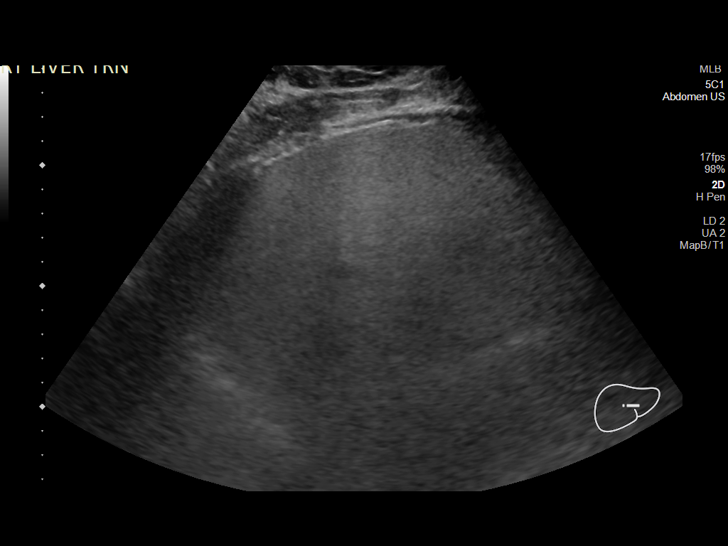
[im 25/37]
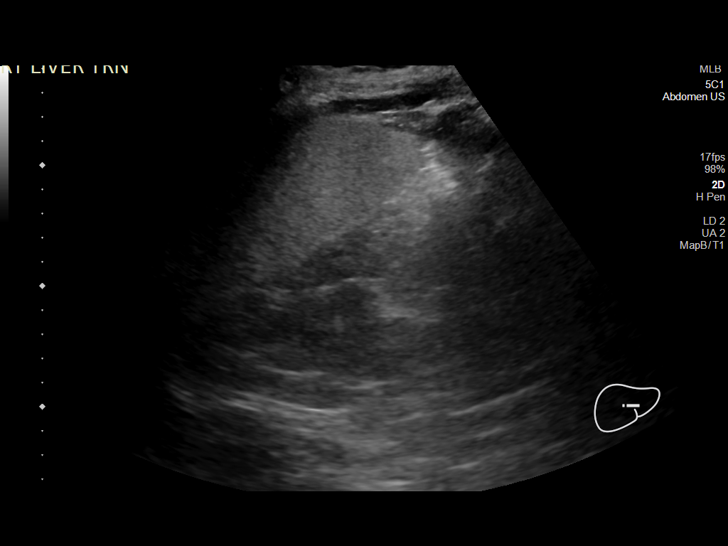
[im 28/37]
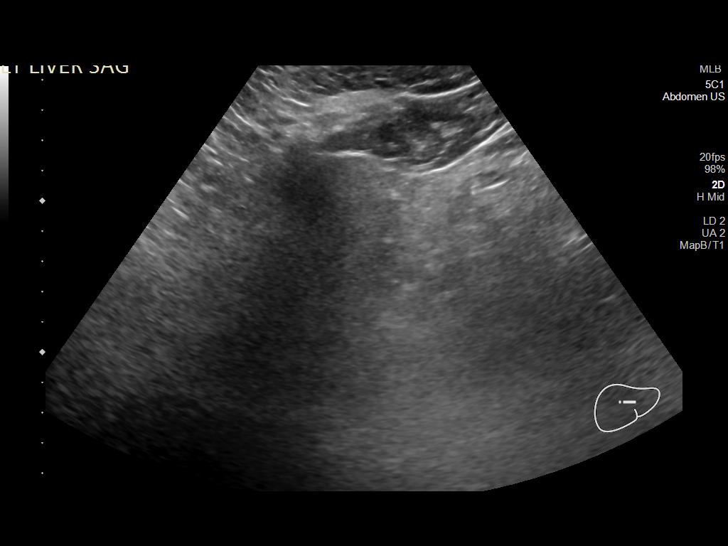
[im 31/37]
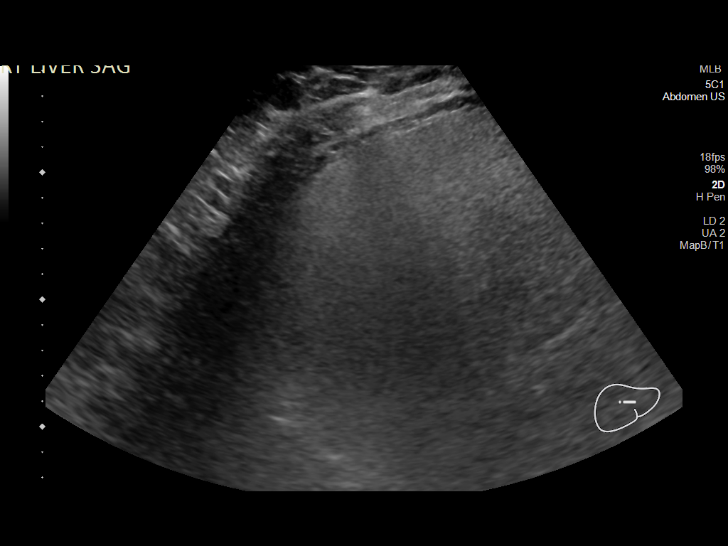
[im 34/37]
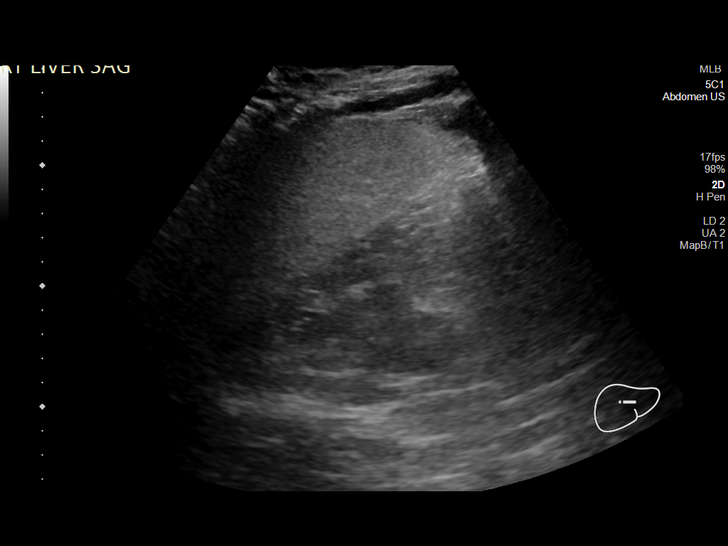
[im 37/37]
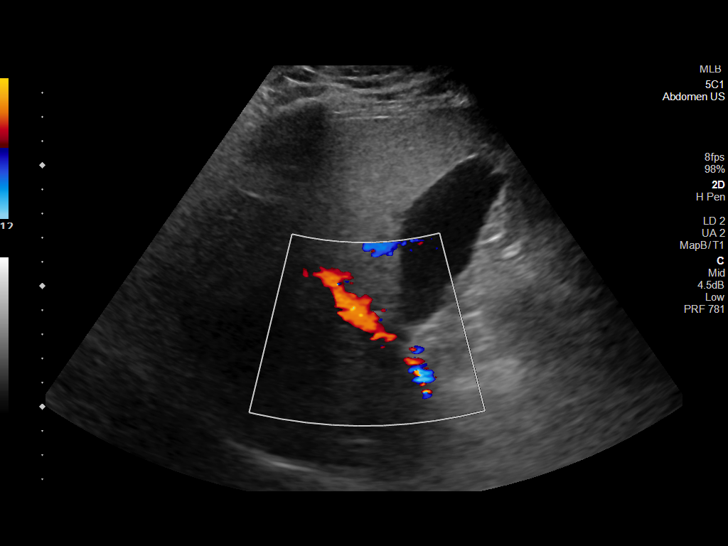

[14 of 25 positions shown; findings below may reference images not displayed]

FINDINGS: Gallbladder:

No gallstones or wall thickening visualized. No sonographic Murphy
sign noted by sonographer.

Common bile duct:

Diameter: 5 mm

Liver:

There is increased echogenicity in the liver. No focal abnormality
is seen. Portal vein is patent on color Doppler imaging with normal
direction of blood flow towards the liver.

Other: None.
IMPRESSION: No sonographic abnormality is seen in the gallbladder.  Fatty liver.

## 2022-04-25 ENCOUNTER — Ambulatory Visit: Payer: 59 | Admitting: Podiatry

## 2022-04-25 ENCOUNTER — Other Ambulatory Visit: Payer: Self-pay | Admitting: Podiatry

## 2022-04-25 DIAGNOSIS — Z79899 Other long term (current) drug therapy: Secondary | ICD-10-CM

## 2022-04-25 DIAGNOSIS — B351 Tinea unguium: Secondary | ICD-10-CM

## 2022-04-25 DIAGNOSIS — L600 Ingrowing nail: Secondary | ICD-10-CM | POA: Diagnosis not present

## 2022-04-25 NOTE — Progress Notes (Signed)
Subjective:  Patient ID: Stephen Nguyen, male    DOB: Jan 29, 1976,  MRN: [id number]  Chief Complaint  Patient presents with   Nail Problem    46 y.o. male presents with the above complaint.  Patient presents with left hallux medial border ingrown pain for touch is progressive normal hurts with ambulation hurts with pressure no infection he is not taking antibiotics he has not seen anyone else prior to seeing me.  He has secondary complaint of bilateral hallux onychomycosis he tried some topical stuff has not helped.  He would like to discuss oral medication.   Review of Systems: Negative except as noted in the HPI. Denies N/V/F/Ch.  Past Medical History:  Diagnosis Date   GERD (gastroesophageal reflux disease)    Hypertension    Migraines     Current Outpatient Medications:    acetaminophen (TYLENOL) 325 MG tablet, Take 2 tablets (650 mg total) by mouth every 6 (six) hours as needed for mild pain or fever (or Fever >/= 101)., Disp: , Rfl:    albuterol (VENTOLIN HFA) 108 (90 Base) MCG/ACT inhaler, Inhale 2 puffs into the lungs every 4 (four) hours as needed for wheezing or shortness of breath., Disp: 18 g, Rfl: 11   COVID-19 mRNA bivalent vaccine, Pfizer, (PFIZER COVID-19 VAC BIVALENT) injection, Inject into the muscle., Disp: 0.3 mL, Rfl: 0   Fluticasone-Salmeterol (ADVAIR DISKUS) 250-50 MCG/DOSE AEPB, Inhale 1 puff into the lungs 2 (two) times daily., Disp: 180 each, Rfl: 3   influenza vac split quadrivalent PF (FLUARIX QUADRIVALENT) 0.5 ML injection, Inject into the muscle., Disp: 0.5 mL, Rfl: 0   metoprolol tartrate (LOPRESSOR) 25 MG tablet, Take 25 mg by mouth 2 (two) times daily., Disp: , Rfl:    Multiple Vitamin (ONE-A-DAY MENS PO), Take 1 tablet by mouth daily., Disp: , Rfl:    omeprazole (PRILOSEC) 40 MG capsule, Take 40 mg by mouth daily as needed for heartburn., Disp: , Rfl:    SUMAtriptan (IMITREX) 50 MG tablet, Take 50 mg by mouth every 2 (two) hours as needed for  migraine. May repeat in 2 hours if headache persists or recurs., Disp: , Rfl:    verapamil (CALAN-SR) 120 MG CR tablet, Take 120 mg by mouth at bedtime., Disp: , Rfl:    vitamin C (ASCORBIC ACID) 500 MG tablet, Take 1 tablet (500 mg total) by mouth 2 (two) times daily., Disp: 15 tablet, Rfl: 0  Social History   Tobacco Use  Smoking Status Never  Smokeless Tobacco Never    No Known Allergies Objective:  There were no vitals filed for this visit. There is no height or weight on file to calculate BMI. Constitutional Well developed. Well nourished.  Vascular Dorsalis pedis pulses palpable bilaterally. Posterior tibial pulses palpable bilaterally. Capillary refill normal to all digits.  No cyanosis or clubbing noted. Pedal hair growth normal.  Neurologic Normal speech. Oriented to person, place, and time. Epicritic sensation to light touch grossly present bilaterally.  Dermatologic Painful ingrowing nail at medial nail borders of the hallux nail left. No other open wounds. No skin lesions.  Orthopedic: Normal joint ROM without pain or crepitus bilaterally. No visible deformities. No bony tenderness.   Radiographs: None Assessment:   1. Encounter for long-term (current) use of high-risk medication   2. Nail fungus   3. Onychomycosis due to dermatophyte   4. Ingrown left big toenail    Plan:  Patient was evaluated and treated and all questions answered.  Bilateral hallux onychomycosis -Educated the  patient on the etiology of onychomycosis and various treatment options associated with improving the fungal load.  I explained to the patient that there is 3 treatment options available to treat the onychomycosis including topical, p.o., laser treatment.  Patient elected to undergo p.o. options with Lamisil/terbinafine therapy.  In order for me to start the medication therapy, I explained to the patient the importance of evaluating the liver and obtaining the liver function test.  Once  the liver function test comes back normal I will start him on 82-month course of Lamisil therapy.  Patient understood all risk and would like to proceed with Lamisil therapy.  I have asked the patient to immediately stop the Lamisil therapy if she has any reactions to it and call the office or go to the emergency room right away.  Patient states understanding   Ingrown Nail, left -Patient elects to proceed with minor surgery to remove ingrown toenail removal today. Consent reviewed and signed by patient. -Ingrown nail excised. See procedure note. -Educated on post-procedure care including soaking. Written instructions provided and reviewed. -Patient to follow up in 2 weeks for nail check.  Procedure: Excision of Ingrown Toenail Location: Left 1st toe medial nail borders. Anesthesia: Lidocaine 1% plain; 1.5 mL and Marcaine 0.5% plain; 1.5 mL, digital block. Skin Prep: Betadine. Dressing: Silvadene; telfa; dry, sterile, compression dressing. Technique: Following skin prep, the toe was exsanguinated and a tourniquet was secured at the base of the toe. The affected nail border was freed, split with a nail splitter, and excised. Chemical matrixectomy was then performed with phenol and irrigated out with alcohol. The tourniquet was then removed and sterile dressing applied. Disposition: Patient tolerated procedure well. Patient to return in 2 weeks for follow-up.   No follow-ups on file.

## 2022-04-26 LAB — HEPATIC FUNCTION PANEL
ALT: 46 IU/L — ABNORMAL HIGH (ref 0–44)
AST: 30 IU/L (ref 0–40)
Albumin: 4.8 g/dL (ref 4.1–5.1)
Alkaline Phosphatase: 77 IU/L (ref 44–121)
Bilirubin Total: 0.7 mg/dL (ref 0.0–1.2)
Bilirubin, Direct: 0.16 mg/dL (ref 0.00–0.40)
Total Protein: 7.3 g/dL (ref 6.0–8.5)

## 2022-05-02 ENCOUNTER — Telehealth: Payer: Self-pay

## 2022-05-02 NOTE — Telephone Encounter (Signed)
-----   Message from Felipa Furnace, DPM sent at 04/30/2022  1:33 PM EDT -----  ----- Message ----- From: Lavone Neri Lab Results In Sent: 04/27/2022   3:01 PM EDT To: Felipa Furnace, DPM

## 2024-05-18 ENCOUNTER — Inpatient Hospital Stay
Admission: RE | Admit: 2024-05-18 | Discharge: 2024-05-18 | Disposition: A | Discharge status: Home / Self Care | Payer: Self-pay | Source: Ambulatory Visit | Attending: Radiation Oncology | Admitting: Radiation Oncology

## 2024-05-18 ENCOUNTER — Telehealth: Payer: Self-pay | Admitting: Radiation Oncology

## 2024-05-18 ENCOUNTER — Other Ambulatory Visit: Payer: Self-pay | Admitting: Radiation Oncology

## 2024-05-18 DIAGNOSIS — C01 Malignant neoplasm of base of tongue: Secondary | ICD-10-CM

## 2024-05-18 NOTE — Telephone Encounter (Signed)
 10/8 sent via stat fax request for recent images to be pushed to powershare from [facility].

## 2024-05-18 NOTE — Telephone Encounter (Signed)
 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 Already has appt with [facility] - Radiation Oncology dept.  He decline any appts till he see his [place] doctor.  Left message with Dr. Assunta ref coord so they are aware.

## 2024-05-18 NOTE — Telephone Encounter (Signed)
 [facility], requesting Dr Graig, please advise. Thank you

## 2024-05-18 NOTE — Telephone Encounter (Signed)
 Left message with [facility] -Imaging library to confirmed received stat fax requested for recent images to be pushed to powershare.  Waiting on images.

## 2024-05-31 NOTE — H&P (View-Only) (Signed)
 History of present illness:  Stephen Nguyen is a 48 y.o. 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 He  has been referred by Dr Graig .      He developed swelling in the left  upper neck ~May 2025.   Seemed to appear suddenly .  Since that time it has enlarged a bit.    Has had some left sore throat over this time.  Has a tickle or sensation in the left throat.   Denies pain but is tender, eg when lying on that side.SABRA     He  denies  dysphagia, otalgia, voice change, hemoptysis.   No  facial weakness  Office  endoscopy by Dr Maggie  showed a left tongue base mass, subsequent DL/biopsy confirmed SCCa, HPV+.    Op note describes inability to expose with Macivor mouth gag, so a Miller blade laryngoscope was used.    US  done, showing 2.6 cm left neck node.  FNA was nondiagnostic.   CT done, report pending.   PET done, with avidity in the oropharynx and left neck.       PMH -HTN, PTSD, asthma   Tobacco - denies    History  . Past Medical History: Medical History[1] .     SABRA Past Surgical History:Surgical History[2]     . Functional status: able to climb stairs?  Yes    . Medications: Medication list is documented in [facility] and was reviewed. He  has a current medication list which includes the following prescription(s): albuterol  hfa, amlodipine, omeprazole, sildenafil, sumatriptan, and topiramate.  . Allergies: Allergies[3]    . Social History      City:  [place]       The patient is here today   accompanied by his spouse       Employment :    Works for [employer]       Alcohol use:  reports that he does not currently use alcohol.        Tobacco:  reports that he has never smoked. He has never been exposed to tobacco smoke. He has never used smokeless tobacco.         Physical Examination  . Vitals: Blood pressure (!) 144/90, pulse 87, weight 101 kg (222 lb), SpO2 100%.  . General appearance of patient: healthy and no  distress  . Quality of voice: no hoarseness, no dysarthria  . Inspection of head and face: Normocephalic, without obvious abnormality, sinuses nontender to percussion  . Left parotid gland: soft, nontender, no swelling, no masses/lesions . Right parotid gland: soft, nontender, no swelling, no masses/lesions . Facial strength: intact, symmetric  . Oral cavity: tongue mobile, FOM soft, mucosa healthy throughout with no masses, lesions, ulcerations  . Oropharynx: large tongue, mucosa of tonsillar fossae healthy;  no mucosal ulcerations or masses or other worrisome lesions . Mirror Examination: Attempted but difficult due to gag reflex.   . Neck: soft, supple,  palpable lymph node(s) in left upper neck,  mildly tender, mobile . Thyroid: normal size, nontender, no nodules palpable   Procedure:  Flexible fiberoptic laryngoscopy Technique:  After anesthetizing the nasal cavity with topical lidocaine  and oxymetazoline, the flexible endoscope was introduced and passed through the nasal cavity into the nasopharynx. The scope was then advanced to the level of the oropharynx.  There is an exophytic tumor of the left oropharynx, difficult to tell if it originates on the  tongue base  or the inferior tonsil /lateral pharygneal  wall.  The epiglottis, aryepiglottic folds, hypopharynx, supraglottis, glottis were visualized and appeared healthy without mucosal masses or lesions.  Vocal fold mobility was intact and symmetric. The scope was withdrawn from the nose. He tolerated the procedure well.    Procedures  : CPT [id number] Flexible fiberoptic laryngoscopy          Data reviewed      Outside records:    I reviewed records from the referring provider's office visits.  These describe the history, workup, and/or treatment of this problem thus far   Pathology:      04/29/24 Final Diagnosis  A. LEFT BASE OF TONGUE, BIOPSY :               Nonkeratinizing squamous cell carcinoma, HPV-associated    p16-positive HPV ISH positive       04/21/24  Left, Fine Needle Aspiration:              Atypical cells, suspicious for carcinoma.         Imaging:   CT neck 05/27/24 Report pending             PET/CT SKULL BASE TO MID THIGH, 05/20/2024 10:24 AM    FINDINGS:  Asymmetric activity left base of tongue SUV max 12. Enlarged left level 2A lymph nodes SUV max 13. No definite evidence of hypermetabolic right cervical lymph nodes.   Mild activity at the gastric antrum, likely inflammatory/physiologic. No evidence of enlarged or hypermetabolic lymph nodes in chest abdomen or pelvis.   No hypermetabolic bony lesions.    CONCLUSION: 1.  Hypermetabolic left base of tongue mass consistent with known primary malignancy. 2.  Hypermetabolic left level 2A lymph nodes consistent with metastases. 3.  No additional suspicious FDG avid foci.                  Impression  Left oropharyngeal squamous cell carcinoma , p16+, HPV+, with cervical lymph node involvement   Treatment options were reviewed. These include initiating treatment with surgery (followed by adjuvant therapy as indicated by pathology results) vs beginning with chemoradiotherapy (with surgery reserved for salvage).   Pros and cons of these approaches were discussed.   Surgery in this case would be:   TORS resection of the primary tumor (maybe the tonsil, maybe the base of tongue ) , left vs bilateral neck dissection.    It is  likely that RT will be recommended postoperatively, but at adjuvant doses rather than definitive doses, and in about 2/3 of cases we can avoid chemotherapy.   Risks of this procedure have been discussed.  These risks include but are not limited to, risks of anesthesia such as heart attack, stroke, death; risks of any surgery such as bleeding, infection, blood clots, pneumonia; risks of this particular surgery including risk of the TORS such as pain, difficulty swallowing with need for  feeding tube which may be prolonged, changes in speech, injury to the teeth, gums, lips, or tongue, inability to remove the entire tumor;  risks of the neck dissection such as numbness, poor cosmetic appearance, weakness of nerves to the shoulder, lip, tongue, or larynx, chyle leak; risks of any tumor surgery, such as recurrence, need for further procedures or other treatments.       Despite these risks, he  wishes to initiate   therapy with surgery .  We will    schedule this for the near future    [employer]  obtained  Note that he meets with a  Radiation oncologist at the [facility], cannot remember their name though    cc:   [name] Cheryl Inks, MD, Arthea Maude Fries, MD , Beverley [employer], MD       Coding:    By time:  The provider time of the today's visit was 60 minutes. This included time spent face-to-face obtaining history and performing examination, review of office notes from other providers, labs, imaging since our last visit, counseling and educating the patient/family/caregiver, ordering tests/treatments/procedures, communicating with  other providers, care coordination, and/or documentation of the visit.      60-74 minutes: [id number]               [1] Past Medical History: Diagnosis Date  . Asthma 2020   After Covid 19  . Hypertension   . Joint pain 2003   Both shoulders  . PTSD (post-traumatic stress disorder)    Several traumatic events  [2] Past Surgical History: Procedure Laterality Date  . APPENDECTOMY  1992  . COLONOSCOPY  2021  . HERNIA REPAIR  1978  . MOUTH SURGERY  N/A 04/29/2024   BASE OF TONGUE BIOPSY performed by Arthea Maude Fries, MD at [facility] OR  . ROTATOR CUFF REPAIR  2005, 2016, 2018  . SHOULDER SURGERY Left    Procedure: SHOULDER SURGERY  . SHOULDER SURGERY Left    Procedure: SHOULDER SURGERY  [3] No Known Allergies

## 2024-06-23 NOTE — Interval H&P Note (Signed)
-------------------------------------------------------------------------------   Attestation signed by Fonda Laraine Muscat, MD at 06/23/2024  1:42 PM I reviewed the patient's status and agree with the plan of care as documented by the resident.   Fonda JONETTA Muscat, MD   -------------------------------------------------------------------------------  The surgical history has been reviewed and remains accurate without interval change. The patient was re-examined and patient's physiologic condition has not changed significantly in the last 30 days. The condition still exists that makes this procedure necessary. The treatment plan remains the same, without new options for care. No new pharmacological allergies or types of therapy has been initiated that would change the plan or the appropriateness of the plan. The patient and/or family understand the potential benefits and risks. Review of systems is negative

## 2024-06-23 NOTE — Anesthesia Preprocedure Evaluation (Addendum)
 Patient: Stephen Nguyen  Procedure Information     Date/Time: 06/23/24 1522   Procedures:      TORS (Transoral robotic surgery) (Left)     DISSECTION NECK MODIFIED - left possible right (Neck)   Location: [facility] OR 37 / [facility] OR   Surgeons: Fonda Laraine Muscat, MD       Relevant Problems  CARDIOVASCULAR  (+) Benign essential HTN  (+) Migraine headache    GI  (+) Gastroesophageal reflux disease    NEUROLOGY  (+) Migraine headache    BP (!) 145/92   Pulse 89   Temp 97.6 F (36.4 C) (Oral)   Resp 20   Ht 1.753 m (5' 9)   Wt 101 kg (222 lb)   SpO2 98%   BMI 32.78 kg/m    Clinical information reviewed:  Tobacco  Allergies  Meds  Med Hx  Surg Hx  Fam Hx  Soc Hx     Anesthesia Evaluation  Anesthesia History: Patient has history of anesthetic complications. Additional anesthesia history: Delayed emergence PONV Predictive Score (Scale 0-5):  Apfel risk score: 0 Respiratory: Additional comments: Asthma developed after COVID, rare inhaler use, last use 6 months ago   Cardiovascular: Patient has high blood pressure.  Neurological: Additional comments: migraine Renal/Gastrointestinal: Patient has GERD.  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  I have examined the patient and there have been no changes since the H&P was performed.  I have  reviewed current medication(s), drug allergies, lab results, ancillary studies and consultations within the EMR.   Medication guidelines appropriate for surgical urgency have been followed.  I have confirmed NPO status as applicable to the relative urgency of this case.   I have confirmed the ASA status of this patient.  Anesthesia risks, benefits and alternatives have been discussed with this  patient or the patient's legal representative.    I have discussed the anesthetic plan with the resident, fellow, CRNA and/or SRNA prior to initiating this anesthetic.   Plan ASA score: 3  Anesthesia type: general  Informed Consent Anesthetic plan and risks discussed with patient. Use of blood products discussed with patient whom consented to blood products. Plan discussed with resident.   Date of Last Liquid: 06/23/24 Time of last liquid: 1030 Date of Last Solid: 06/22/24 Time of last solid: 1800

## 2024-06-25 NOTE — H&P (Signed)
-------------------------------------------------------------------------------   Attestation signed by Fonda Laraine Muscat, MD at 06/25/2024 10:32 AM I reviewed the patient's status and agree with the plan of care as documented by the resident.   Fonda JONETTA Muscat, MD   -------------------------------------------------------------------------------   Patient with oropharyngeal bleeding morning of 11/15, will be taken back to OR for bleeding control. The surgical history has been reviewed and remains accurate without interval change.  The patient was re-examined and patient's physiologic condition has not changed significantly in the last 30 days. The condition still exists that makes this procedure necessary. The treatment plan remains the same, without new options for care.  No new pharmacological allergies or types of therapy has been initiated that would change the plan or the appropriateness of the plan.  The patient and/or family understand the potential benefits and risks.  A complete review of systems was obtained and is otherwise negative.  We will proceed with CONTROL BLEED TONSIL ADENOID.

## 2024-06-27 NOTE — Progress Notes (Signed)
 Case Management Adult Assessment  CSN: [id number] DOB: 07-01-1976 Service: Ear/Nose/Throat Location: C929/A   Info & Contacts Assessment Completed: In-person interview with Patient The patient's status at this time is:: Able to communicate Prior to admission, patient resided at: Private residence Prior to admission, patient lived with : Spouse/Significant Other, Children The patient's decision maker is:: Patient At discharge, will patient return to prior residence: Yes Barriers to education: No barriers   Extended Emergency Contact Information Primary Emergency Contact: Velez,[name] Address: [address]          [place], [place] [address] [place] Mobile Phone: [phone] Relation: Spouse Secondary Emergency Contact: [facility] Address: [address]          [place], [place] [address] [place] Mobile Phone: [phone] Relation: Son  Assessment Is this patient at baseline?: No (s/p TORS and dissection c/b bleed, drains, DHT on TF) Was patient independent with ADLs prior to admission?: Yes Was patient independent with mobility prior to admission?: Yes Does this patient have or need any DME?: No, Likely to need the following Patient is likely to need the following DME: Tube feeds Does this patient have or need any home health services?: No Does this patient have or need any personal care services?: No   Social Does patient have a mental health diagnosis?: Yes Mental Health Diagnoses: PTSD (per EHR)   Discharge How will patient obtain prescription meds at discharge:: [facility], [employer] (pt states also has Facilities Manager) Type of Payer Source:: [facility], [employer] How will this patient obtain follow-up care after discharge?: PCP office, Outpatient clinic How will this patient reach the discharge destination?: Family/Friends Is this a Chronic Dialysis patient?: No Patient Discharge Goals of Care: Return to  Work/Prior Level of Function  Discharge Education Discharge Plan Discussed With:: Patient Education Readiness:: Acceptance Education Method:: Explanation Education Response:: Verbalizes Understanding     CM met with patient at the bedside; introduced self and role. Patient confirmed above information is accurate. Pt voices wanting CM to submit to [facility] for coverage of TF. CM provided education regarding process for arranging TF through the [facility], including likely needing to purchase TF OOP to cover the time between discharging from the hospital to the TF arriving to his residence; pt voiced understanding. Pt denies need HHSN at this time and CM encouraged pt/family to participate in TF administration prior to discharge; pt voiced understanding. Pt denies any additional DME needs at this time.    TF order placed and CM completed VA form. VA form and clinical information emailed to the [facility] at [email or web address].      Case Management Coordination Status: Coordination In-Progress   Anticipated Discharge Location: Home with Durable Medical Equipment  If Plan A discharging location is not feasible: Potential Plan B: Home with Durable Medical Equipment  Durable Medical Equipment Coordination Status: Referral sent and pending status update. At this time, anticipated disposition to discharge home with TF through the [facility] when medically ready. Case Manager/Social Worker remains available to assist as indicated.       Rollene Eveleen Hummer, RN Office: [phone]

## 2024-06-27 NOTE — Progress Notes (Signed)
 Inpatient Speech Language Pathology Plan of Care Note  Stephen Nguyen 48 y.o.  Tentative plan for MBS this date. Per discussion with ENT, plan to hold MBS at this time pending ongoing improvement in patient status given recent bleed and decreased secretion management currently. Will remain available as patient is appropriate.   *No charge associated with this note  If you have any questions regarding this patient, please notify the active/available SLP on patient's treatment team.

## 2024-06-27 NOTE — Consults (Signed)
 Nutrition Note   PATIENT NAME: DYRELL TUCCILLO DATE: 06/27/2024  TIME: 1:41 PM  Note Type: Brief note Referral Reason: Care team referral, TF  Assessment  Pt reported unable to swallow. Discontinued Ensure. Pt reported tolerating TF well. Reported no BM yet, but doesn't feel constipated. TF recommendations below. Will continue to follow.     Interventions   Nutrition Intervention: General healthful diet, Medical food supplements, Enteral nutrition   Diet per MD discretion.  Continuing Osmolite 1.5 @ 240 mL 6x daily (provides 2160 kcal, 90 g protein, 1097 mL water).  Continuing 90 ml water before and after boluses.   Added Prostat once daily (100 cal, 15 g prot).     Estimated Needs  Nutrition Needs Based On: IBW 72.7 kg (160 lb 4.4 oz)  Calories (kcal/day): 2181  Calories based on:  (30 kcal/kg)  Protein (g/day): 109-145  Protein based on: IBW, 1.5-2g/kg (72.7 kg)  Fluid (mL/day):   1-1.1 mL/kcal      Follow-up Information  Follow-Up Date: 07/01/24     Contact RD on treatment team. After hours or weekends, use secure chat group: [facility]  [facility] Dietitians [place]  [facility] Dietitians [facility]  [facility] Dietitians [facility]  [facility]  [facility] Dietitians   [place] NOVAK [place], [place] 06/27/2024 1:41 PM

## 2024-06-28 NOTE — Care Plan (Signed)
  Problem: PAIN - ADULT Goal: Verbalizes/displays adequate comfort level or baseline comfort level Description: INTERVENTIONS: 1. Encourage pt to monitor pain and request assistance 2. Assess pain using appropriate pain scale 3. Administer analgesics based on type and severity of pain and evaluate response 4. Implement non-pharmacological measures as appropriate and evaluate response 5. Consider cultural and social influences on pain and pain management 6. Notify LIP if interventions unsuccessful or patient reports new pain Outcome: Progressing   Problem: INFECTION - ADULT Goal: Absence of infection during hospitalization Description: INTERVENTIONS: 1. Assess and monitor for signs and symptoms of infection 2. Monitor lab/diagnostic results 3. Monitor all insertion sites i.e., indwelling lines, tubes and drains 4. Monitor endotracheal (as able) and nasal secretions for changes in amount and color 5. Institute appropriate cooling/warming therapies per order 6. Administer medications as ordered 7. Instruct and encourage patient and family to use good hand hygiene technique 8. Identify and instruct in appropriate isolation precautions for identified infection/condition Outcome: Progressing Goal: Absence of fever/infection during anticipated neutropenic period Description: INTERVENTIONS 1. Monitor WBC 2. Administer growth factors as ordered 3. Implement neutropenic guidelines as ordered Outcome: Progressing   Problem: Safety - Adult Goal: Free from fall injury Description: INTERVENTIONS: 1. Assess pt frequently for physical needs 2. Identify cognitive and physical deficits and behaviors that affect risk of falls. 3. Institute fall precautions as indicated by assessment. 4. Educate pt/family on patient safety including physical limitations 5. Instruct pt to call for assistance with activity based on assessment 6. Modify environment to reduce risk of injury 7. Consider OT/PT consult  to assist with strengthening/mobility Outcome: Progressing Goal: Absence of infection during hospitalization Description: INTERVENTIONS: 1. Assess and monitor for signs and symptoms of infection 2. Monitor lab/diagnostic results 3. Monitor all insertion sites i.e., indwelling lines, tubes and drains 4. Monitor endotracheal (as able) and nasal secretions for changes in amount and color 5. Institute appropriate cooling/warming therapies per order 6. Administer medications as ordered 7. Instruct and encourage patient and family to use good hand hygiene technique 8. Identify and instruct in appropriate isolation precautions for identified infection/condition Outcome: Progressing   Problem: DISCHARGE PLANNING Goal: Discharge to home or other facility with appropriate resources Description: INTERVENTIONS: 1. Identify barriers to discharge w/pt and caregiver 2. Arrange for needed discharge resources and transportation as appropriate 3. Identify discharge learning needs (meds, wound care, etc) 4. Arrange for interpreters to assist at discharge as needed 5. Refer to Case Management Department for coordinating discharge planning if the patient needs post-hospital services based on physician order or complex needs related to functional status, cognitive ability or social support system Outcome: Progressing   Problem: Chronic Conditions and Co-Morbidities Goal: Patient's chronic conditions and co-morbidity symptoms are monitored and maintained or improved Description: INTERVENTIONS: 1. Monitor and assess patient's chronic conditions and comorbid symptoms for stability, deterioration, or improvement 2. Collaborate with multidisciplinary team to address chronic and comorbid conditions and prevent exacerbation or deterioration 3. Update acute care plan with appropriate goals if chronic or comorbid symptoms are exacerbated and prevent overall improvement and discharge Outcome: Progressing   Problem:  Health Behavior: Goal: MCB Ability to state ways to decrease the risk of falls will be met by discharge Description: Ability to state ways to decrease the risk of falls will improve by discharge Outcome: Progressing   Problem: Safety: Goal: Will remain free from falls by discharge Description: Will remain free from falls by discharge Outcome: Progressing

## 2024-06-29 NOTE — Unmapped External Note (Signed)
 Speech Language Pathology  Modified Barium Swallow: CPT 92611  Briston J Glaza 48 y.o.  At the request of Asberry Gaines Ferrari, PA-C, a Modified Barium Swallow (MBS) was conducted on June 29, 2024.  Treatment Diagnosis: Dysphagia, oropharyngeal phase Pain:  denied  Patient Goal:  I can't swallow  Interpreter used?: No Family/Caregiver present for session: No Cultural/Spiritual Beliefs to Incorporate into Treatment Sessions:  No  Impression:  Patient presents with severe-profound pharyngeal dysphagia with aspiration across all consistencies. Oral phase was unremarkable. Swallow initiation was timely. Pharyngeal phase characterized by reduced tongue base retraction, decreased pharyngeal constriction, incomplete epiglottic inversion, reduced hyolaryngeal elevation/excursion, incomplete laryngeal vestibule closure with reduced UES opening. Collectively these deficits resulted in aspiration of all consistencies including thin, mildly thick and moderately thick liquids. Aspiration occurring after the swallow given very minimal UES opening resulting in severe UES/pyriform residuals with spill over into the trachea. Patient sensate to aspiration triggering reflexive coughing, however not fully effective in clearing aspirated material. Chin tuck, small bolus size and head turn right and left were all attempted without success. Further trials were deferred given severity of swallow.   Based on today's assessment, unable to recommend a safe oral diet at this time. Recommend NPO with alternate means of nutrition, hydration and medication. Patient may consume ice chips and small sips of water, following thorough oral care in moderation for oral comfort.   Recommendations: Diet: NPO Medications: via alternate means  Additional Recommendations: Regular and thorough oral care, best practice is toothbrush and toothpaste Allow ice chips and sips of unthickened water after good oral care for  patient comfort maintenance; ice chips and sips of unthickened water should be provided in moderation, with assistance only, and only as respiratory status allows Consult dietitian to ensure adequate nutrition met Outpatient Speech Pathology follow up at discharge. To set up outpatient appointment, please: Place Ambulatory Referral for Speech Therapy - Swallowing Evaluation  Medical History: Reason for admission, as well as past and current medical history, has been reviewed and can be found in patient's medical record.  Past Medical History:  Medical History[1]  Procedure: Study performed with radiologist present to assess the oral and pharyngeal phases of the swallow in the lateral and/or anterior-posterior view to assess swallowing physiology and aspiration risk. Test boluses were administered as indicated below. All boluses were mixed with barium for visualization. The study was recorded.  Assessment: Current diet: Adult Diet- Liquid; Full liquids Mental Status: Alert Positioning: Upright in bed  Respiratory Status:  O2 Device: None (Room air)  Test Boluses: Consistencies provided: Thin Liquids, Mildly Thick Liquids, and Moderately Thick Liquids Liquids provided via: Spoon Views: Lateral  Findings: Oral Phase: WFL Swallow Initiation Phase: Timely Pharyngeal Phase: Decreased laryngeal elevation, Reduced anterior hyoid excursion, Incomplete epiglottic movement, Incomplete laryngeal vestibular closure, Diminished pharyngeal stripping wave, Decreased base of tongue retraction Residue: Moderate - half the bolus remains, Severe - > half the bolus remains, in the valleculae, in the pyriform sinus, at the UES/interarytenoid area Laryngeal Penetration Occurred with: Thin Liquids, Mildly Thick Liquids, and Moderately Thick Liquids Laryngeal Penetration Was: After the swallow Aspiration Occurred With: Thin Liquids, Mildly Thick Liquids, and Moderately Thick Liquids Aspiration Was: After  the swallow Nasal regurgitation: No Esophageal Regurgitation into Hypopharynx: No Opening of the UES/Cricopharyngeus: Reduced  Penetration-Aspiration Scale (PAS): Thin Liquid: 7 Mildly Thick: 7 Moderately Thick: 7  1 Material does not enter airway  2 Material enters the airway, remains above the vocal folds and is ejected from the airway  3 Material enters the airway, remains above the vocal folds and is not ejected from the airway  4 Material enters the airway, contacts the vocal folds and is ejected from the airway  5 Material enters the airway, contacts the vocal folds and is not ejected from the airway  6 Material enters the airway, passes below the vocal folds and is ejected into the larynx or out of the airway  7 Material enters the airway, passes below the vocal folds and is not ejected from the trachea despite effort  8 Material enters the airway, passes below the vocal folds and no effort is made to eject   Compensatory Strategies Throat Clear/Cough: ineffective Small Bites/Sips: ineffective Spoon: ineffective  Chin tuck: ineffective Head Turn - Right: ineffective Head Turn - Left: ineffective Effortful Swallow: ineffective  Prognosis: Guarded due to:, current medical status  Re-Evaluate: If clinically indicated, 1-2 weeks s/p discharge  Goals: Encounter Problems     Encounter Problems (Active)     Dysphagia     Patient will participate in instrumental swallowing evaluation(s) to examine the oropharyngeal swallow, advise therapeutic interventions, and to assist with diet recommendations     Start:  06/24/24    Expected End:  07/01/24             Please contact SLP for re-eval if difficulty observed or if patient status changes.  Education:  Results of this evaluation were thoroughly discussed with the patient, RN, family (if present), and relayed to MD, PA, or NP following the session.   If you have any questions regarding this patient, please notify the  active/available SLP on patient's treatment team.  Thank you for this referral.  Start Time: 1438 Stop Time: 1500 SLP Total Treatment Time: 22    SLP Eval Charges Eval Swallow Video/Fluoro (07388) minutes: 22 Mins   Charges           06/29/2024   Code Description Service Provider Modifiers Quantity  [id number] Hc St Eval Swallow Function Cine/Video Record Recardo Bari Raring, SLP GN 1        Time of Service Note Type Status  None                 [1] Past Medical History: Diagnosis Date  . Adverse effect of anesthesia    foley cath was put in sec to inability to urinate post surgery  . Asthma 2020   After Covid 19  . COVID   . Delayed emergence from general anesthesia   . Hypertension   . Joint pain 2003   Both shoulders  . PTSD (post-traumatic stress disorder)    Several traumatic events

## 2024-06-30 NOTE — Discharge Summary (Signed)
 ------------------------------------------------------------------------------- Attestation signed by Fonda Laraine Muscat, MD at 06/30/2024  5:45 PM I reviewed the patient's status and agree with the plan of care as documented above  Fonda JONETTA Muscat, MD    -------------------------------------------------------------------------------  Otolaryngology Discharge Summary  Patient ID: Stephen Nguyen [id number] 48 y.o. 1976/02/06  Admit date: 06/23/2024 Admitting Physician: Fonda Laraine Muscat, MD Admission Condition: good Admission Diagnoses:   Present on Admission: . 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 *  Procedures/Surgeries performed during hospitalization:  11/13 Transoral robotic surgery (TORS):              -  Left tonsillectomy             - Left partial glossectomy Left  neck dissection, levels II-IV  Right neck dissection, levels IIa-III 11/15 (E2)CONTROL BLEED TONSIL ADENOID (Left: Mouth)  Indication for Admission: Post-operative observation   Hospital Course:Barak J Czerwinski 48 y.o. male with left oropharyngeal/tongue base SCCs/+HPV now s/p left tonsil/tongue base TORS and bl necks. JP x 2. Started w clears but will advance up to mech soft as tolerated. He received decadron  for 24h. Had significant pain on 11/13 overnight, could not tolerate PO pain meds, given IV tylenol  and dilaudid . He is tolerated PO pain meds better POD1. Morning of 11/15, nursing report patient was coughing up blood. On examination for to have significant bleed. Taken back to OR as E2 for bleeding control and dht  placement. Recovered well on the floor after surgery. Trickle feeds started 11/15, advanced to continuous 11/16. MBS when clinically ready. New onset chills, muscle ache, and muscle weakness noted on 11/18. Respiratory viral panel and CXR on 11//18 both negative. Improvement in symptoms noted on the morning on 11/19 MBS was preformed that afternoon with aspiration across consistencies and minimal opening of the UES. SLP recommended NPO. Patient will be discharged with DHT and will follow up in 2 weeks for a repeat swallow eval.   At the time of discharge, the patient was afebrile, in no acute distress and vital signs were stable. New discharge medications were discussed in detail and the patient stated understanding of use and administration. The patient verbalized understanding of all discharge instructions and therefore was released.  Consults: Treatment Team:  Surgeon: Fonda Laraine Muscat, MD  Significant Diagnostic Studies:   As above  Discharge Exam: General: male. AAOx3,  Respiratory: Normal respiratory effort; on room air Head and Neck: Neck soft, flat. Appropriate eschar of the left tonsil bed Enteral access: DHT in place Extremities: warm, well perfused. Moving all equally, no gross deficits.  Disposition: home  Discharge Medications and Instructions:  Discharge Medications:   Medication List     PAUSE taking these medications    omeprazole 40 mg DR capsule Wait to take this until your doctor or other care provider tells you to start again. Until eating by mouth Commonly known as: PriLOSEC Take 40 mg by mouth daily.       START taking these medications    acetaminophen  500 mg tablet Commonly known as: TYLENOL  Take 2 tablets (1,000 mg total) by mouth every 6 (six) hours for 10  days.   celecoxib 200 mg capsule Commonly known as: CeleBREX Take 1 capsule (200 mg total) by mouth 2 (two) times a day for 14 days.   famotidine 40 mg tablet Commonly known as:  Pepcid Take 1 tablet (40 mg total) by mouth daily.   oxyCODONE 5 mg immediate release tablet Commonly known as: ROXICODONE Take 1 tablet (5 mg total) by mouth every 4 (four) hours as needed for moderate pain (4-6).       CONTINUE taking these medications    albuterol  HFA 90 mcg/actuation inhaler Commonly known as: PROVENTIL  HFA;VENTOLIN  HFA;PROAIR  HFA Inhale as needed.   amLODIPine 5 mg tablet Commonly known as: NORVASC Take 1 tablet by mouth daily.   sildenafiL 100 mg tablet Commonly known as: VIAGRA Take 100 mg by mouth as needed.   SUMAtriptan 50 mg tablet Commonly known as: IMITREX Take 50 mg by mouth as needed.         Where to Get Your Medications     These medications were sent to [id number] - [place], [place] - [address] AT [facility] [address] & [facility] [address] - PHONE: [phone] - FAX: [phone]  [address], [place] [address]    Phone: [phone]  celecoxib 200 mg capsule famotidine 40 mg tablet oxyCODONE 5 mg immediate release tablet    You can get these medications from any pharmacy   You don't need a prescription for these medications acetaminophen  500 mg tablet    See AVS for patient instructions. Discharge Orders     Tube Feeds Coordination     Details:    Enteral Access: [place] Comment - Gastric DHT   Method of Administration: Syringe (30/month [id number])   Enteral Options: Formula   Formula Name: Osmolite 1.5 or equivalent   Feeding Schedule: 240 ml x 6 times daily with 90, Prostat once daily   Water Flushes: 90mL before and after boluses   Length of need: Other (please specify) Comment - 2 weeks   SLP videofluoroscopic swallow study         Scheduled Future Appointments       Provider Department Dept Phone Center   07/12/2024 9:45 AM Fonda Rash [facility] - [facility] ENT [phone] [facility] [place]   07/12/2024 10:00 AM Meghan [facility] - MPM  Outpatient Speech Language Pathology [phone] [facility] [place]   07/12/2024 10:00 AM SLP MPM 02 FEES RM 3 [facility] - MPM Outpatient Speech Language Pathology [phone] [facility] [place]       The patient was instructed to call if he develops a fever greater than 101.5, any drainage from the incision (when applicable), redness extending from his incision, unable to void, or any other questions or concerns.    Electronically signed by: Asberry Gaines Ferrari, PA-C 06/30/2024 9:01 AM

## 2024-07-01 ENCOUNTER — Emergency Department (HOSPITAL_BASED_OUTPATIENT_CLINIC_OR_DEPARTMENT_OTHER)

## 2024-07-01 ENCOUNTER — Emergency Department (HOSPITAL_BASED_OUTPATIENT_CLINIC_OR_DEPARTMENT_OTHER): Admitting: Radiology

## 2024-07-01 ENCOUNTER — Other Ambulatory Visit: Payer: Self-pay

## 2024-07-01 ENCOUNTER — Emergency Department (HOSPITAL_BASED_OUTPATIENT_CLINIC_OR_DEPARTMENT_OTHER)
Admission: EM | Admit: 2024-07-01 | Discharge: 2024-07-01 | Disposition: A | Discharge status: Home / Self Care | Attending: Emergency Medicine | Admitting: Emergency Medicine

## 2024-07-01 DIAGNOSIS — R1310 Dysphagia, unspecified: Secondary | ICD-10-CM | POA: Diagnosis not present

## 2024-07-01 DIAGNOSIS — G8918 Other acute postprocedural pain: Secondary | ICD-10-CM | POA: Diagnosis present

## 2024-07-01 DIAGNOSIS — R0602 Shortness of breath: Secondary | ICD-10-CM | POA: Diagnosis not present

## 2024-07-01 DIAGNOSIS — R221 Localized swelling, mass and lump, neck: Secondary | ICD-10-CM | POA: Insufficient documentation

## 2024-07-01 DIAGNOSIS — Z85818 Personal history of malignant neoplasm of other sites of lip, oral cavity, and pharynx: Secondary | ICD-10-CM | POA: Insufficient documentation

## 2024-07-01 LAB — CBC
HCT: 38.8 % — ABNORMAL LOW (ref 39.0–52.0)
Hemoglobin: 13.5 g/dL (ref 13.0–17.0)
MCH: 30.9 pg (ref 26.0–34.0)
MCHC: 34.8 g/dL (ref 30.0–36.0)
MCV: 88.8 fL (ref 80.0–100.0)
Platelets: 373 K/uL (ref 150–400)
RBC: 4.37 MIL/uL (ref 4.22–5.81)
RDW: 12.1 % (ref 11.5–15.5)
WBC: 10.6 K/uL — ABNORMAL HIGH (ref 4.0–10.5)
nRBC: 0 % (ref 0.0–0.2)

## 2024-07-01 LAB — BASIC METABOLIC PANEL WITH GFR
Anion gap: 11 (ref 5–15)
BUN: 16 mg/dL (ref 6–20)
CO2: 26 mmol/L (ref 22–32)
Calcium: 9.5 mg/dL (ref 8.9–10.3)
Chloride: 97 mmol/L — ABNORMAL LOW (ref 98–111)
Creatinine, Ser: 0.96 mg/dL (ref 0.61–1.24)
GFR, Estimated: >60 mL/min (ref >60)
Glucose, Bld: 112 mg/dL — ABNORMAL HIGH (ref 70–99)
Potassium: 4.8 mmol/L (ref 3.5–5.1)
Sodium: 134 mmol/L — ABNORMAL LOW (ref 135–145)

## 2024-07-01 MED ORDER — DEXAMETHASONE SOD PHOSPHATE PF 10 MG/ML IJ SOLN
10.0000 mg | Freq: Once | INTRAMUSCULAR | Status: AC
  Administered 2024-07-01: 10 mg via INTRAVENOUS

## 2024-07-01 MED ORDER — ONDANSETRON HCL 4 MG/2ML IJ SOLN
4.0000 mg | Freq: Once | INTRAMUSCULAR | Status: AC
  Administered 2024-07-01: 4 mg via INTRAVENOUS
  Filled 2024-07-01: qty 2

## 2024-07-01 MED ORDER — IOHEXOL 300 MG/ML  SOLN
75.0000 mL | Freq: Once | INTRAMUSCULAR | Status: AC | PRN
  Administered 2024-07-01: 75 mL via INTRAVENOUS

## 2024-07-01 MED ORDER — HYDROMORPHONE HCL 1 MG/ML IJ SOLN
1.0000 mg | Freq: Once | INTRAMUSCULAR | Status: AC
  Administered 2024-07-01: 1 mg via INTRAVENOUS
  Filled 2024-07-01: qty 1

## 2024-07-01 MED ORDER — AMOXICILLIN-POT CLAVULANATE 400-57 MG/5ML PO SUSR: 875.0000 mg | Freq: Two times a day (BID) | ORAL | 0 refills | Status: AC

## 2024-07-01 NOTE — ED Triage Notes (Addendum)
 Pt POV reporting increased SOB, hx throat cancer, TORS resection 11/13, discharged from hospital 11/20, now reporting increased throat swelling and unable to swallow. Pt reports possible aspiration of blood while in hospital due to persistent bleeding.

## 2024-07-01 NOTE — ED Provider Notes (Signed)
 [place] EMERGENCY DEPARTMENT AT [facility] Provider Note   CSN: [id number] Arrival date & time: 07/01/24  0105     Patient presents with: Shortness of Breath   Stephen Nguyen is a 48 y.o. male.   Patient presents to the emergency department for evaluation of increased difficulty breathing.  Patient with neoplasm at the base of his tongue, underwent transoral robotic assisted resection at [facility] on November 13.  Patient comes to the emergency department with increased difficulty breathing.  He reports feeling like he has swelling in the throat, having difficulty swallowing.       Prior to Admission medications   Medication Sig Start Date End Date Taking? Authorizing Provider  amoxicillin -clavulanate (AUGMENTIN ) 400-57 MG/5ML suspension Take 10.9 mLs (875 mg total) by mouth 2 (two) times daily for 7 days. 07/01/24 07/08/24 Yes Berania Peedin, Lonni [name], MD  acetaminophen  (TYLENOL ) 325 MG tablet Take 2 tablets (650 mg total) by mouth every 6 (six) hours as needed for mild pain or fever (or Fever >/= 101). 11/26/18   Elgergawy, Brayton RAMAN, MD  albuterol  (VENTOLIN  HFA) 108 (90 Base) MCG/ACT inhaler Inhale 2 puffs into the lungs every 4 (four) hours as needed for wheezing or shortness of breath. 08/15/20   Mannam, Praveen, MD  COVID-19 mRNA bivalent vaccine, Pfizer, (PFIZER COVID-19 VAC BIVALENT) injection Inject into the muscle. 05/31/21   Luiz Channel, MD  Fluticasone -Salmeterol (ADVAIR DISKUS) 250-50 MCG/DOSE AEPB Inhale 1 puff into the lungs 2 (two) times daily. 08/15/20   Mannam, Praveen, MD  influenza vac split quadrivalent PF (FLUARIX  QUADRIVALENT) 0.5 ML injection Inject into the muscle. 05/31/21     metoprolol  tartrate (LOPRESSOR ) 25 MG tablet Take 25 mg by mouth 2 (two) times daily.    [provider]  Multiple Vitamin (ONE-A-DAY MENS PO) Take 1 tablet by mouth daily.    [provider]  omeprazole (PRILOSEC) 40 MG capsule Take 40 mg by mouth daily  as needed for heartburn. 11/03/18   [provider]  SUMAtriptan (IMITREX) 50 MG tablet Take 50 mg by mouth every 2 (two) hours as needed for migraine. May repeat in 2 hours if headache persists or recurs.    [provider]  verapamil  (CALAN -SR) 120 MG CR tablet Take 120 mg by mouth at bedtime.    [provider]  vitamin C  (ASCORBIC ACID) 500 MG tablet Take 1 tablet (500 mg total) by mouth 2 (two) times daily. 11/26/18   Elgergawy, Brayton RAMAN, MD    Allergies: Patient has no known allergies.    Review of Systems  Updated Vital Signs BP 133/82   Pulse 89   Temp 99 F (37.2 C)   Resp 20   SpO2 96%   Physical Exam Vitals and nursing note reviewed.  Constitutional:      General: He is not in acute distress.    Appearance: He is well-developed.  HENT:     Head: Normocephalic and atraumatic.     Nose:     Comments: Feeding tube right nare, some dried blood noted    Mouth/Throat:     Mouth: Mucous membranes are moist.  Eyes:     General: Vision grossly intact. Gaze aligned appropriately.     Extraocular Movements: Extraocular movements intact.     Conjunctiva/sclera: Conjunctivae normal.  Neck:     Comments: Surgical incision appears to be healing well, no dehiscence, no swelling, no drainage Cardiovascular:     Rate and Rhythm: Normal rate and regular rhythm.  Pulses: Normal pulses.     Heart sounds: Normal heart sounds, S1 normal and S2 normal. No murmur heard.    No friction rub. No gallop.  Pulmonary:     Effort: Pulmonary effort is normal. No respiratory distress.     Breath sounds: Normal breath sounds.  Abdominal:     Palpations: Abdomen is soft.     Tenderness: There is no abdominal tenderness. There is no guarding or rebound.     Hernia: No hernia is present.  Musculoskeletal:        General: No swelling.     Cervical back: Full passive range of motion without pain, normal range of motion and neck supple. No pain with movement, spinous  process tenderness or muscular tenderness. Normal range of motion.     Right lower leg: No edema.     Left lower leg: No edema.  Skin:    General: Skin is warm and dry.     Capillary Refill: Capillary refill takes less than 2 seconds.     Findings: No ecchymosis, erythema, lesion or wound.  Neurological:     Mental Status: He is alert and oriented to person, place, and time.     GCS: GCS eye subscore is 4. GCS verbal subscore is 5. GCS motor subscore is 6.     Cranial Nerves: Cranial nerves 2-12 are intact.     Sensory: Sensation is intact.     Motor: Motor function is intact. No weakness or abnormal muscle tone.     Coordination: Coordination is intact.  Psychiatric:        Mood and Affect: Mood normal.        Speech: Speech normal.        Behavior: Behavior normal.     (all labs ordered are listed, but only abnormal results are displayed) Labs Reviewed  BASIC METABOLIC PANEL WITH GFR - Abnormal; Notable for the following components:      Result Value   Sodium 134 (*)    Chloride 97 (*)    Glucose, Bld 112 (*)    All other components within normal limits  CBC - Abnormal; Notable for the following components:   WBC 10.6 (*)    HCT 38.8 (*)    All other components within normal limits    EKG: None  Radiology: CT Soft Tissue Neck W Contrast Result Date: 07/01/2024 EXAM: CT NECK WITH CONTRAST 07/01/2024 02:57:49 AM TECHNIQUE: CT of the neck was performed with the administration of intravenous contrast. Multiplanar reformatted images are provided for review. Automated exposure control, iterative reconstruction, and/or weight based adjustment of the mA/kV was utilized to reduce the radiation dose to as low as reasonably achievable. COMPARISON: None available. CLINICAL HISTORY: Recent head and neck surgery, trouble swallowing FINDINGS: AERODIGESTIVE TRACT: Edematous supraglottic larynx , tongue base and oropharyngeal walls without discrete drainable fluid collection. SALIVARY  GLANDS: The parotid and submandibular glands are unremarkable. THYROID: Unremarkable. LYMPH NODES: Left neck dissection. No adenopathy. SOFT TISSUES: No mass or fluid collection. BRAIN, ORBITS, SINUSES AND MASTOIDS: No acute abnormality. LUNGS AND MEDIASTINUM: No acute abnormality. BONES: No focal bone abnormality. IMPRESSION: 1. Edematous supraglottic larynx , tongue base and oropharyngeal walls without discrete drainable fluid collection. Given known recent surgery, findings are likely in part postoperative but infection is not excluded. 2. No priors available for comparison. Recommend follow-up CT neck with contrast after resolution of acute findings to establish a baseline and exclude residual tumor. 3. Left neck dissection. No adenopathy. Electronically signed  by: Gilmore Molt MD 07/01/2024 03:07 AM EST RP Workstation: [id number]   DG Chest 2 View Result Date: 07/01/2024 EXAM: 2 VIEW(S) XRAY OF THE CHEST 07/01/2024 01:36:00 AM COMPARISON: Chest x-ray 11/25/2018 CLINICAL HISTORY: SOB. FINDINGS: LINES, TUBES AND DEVICES: Weighted enteric tube with tip overlying the stomach. LUNGS AND PLEURA: No focal pulmonary opacity. No pleural effusion. No pneumothorax. HEART AND MEDIASTINUM: No acute abnormality of the cardiac and mediastinal silhouettes. BONES AND SOFT TISSUES: No acute osseous abnormality. IMPRESSION: 1. Weighted enteric tube with tip projecting over the stomach. Electronically signed by: Morgane Naveau MD 07/01/2024 01:58 AM EST RP Workstation: [id number]     Procedures   Medications Ordered in the ED  dexamethasone  (DECADRON ) injection 10 mg (has no administration in time range)  iohexol  (OMNIPAQUE ) 300 MG/ML solution 75 mL (75 mLs Intravenous Contrast Given 07/01/24 0248)  HYDROmorphone  (DILAUDID ) injection 1 mg (1 mg Intravenous Given 07/01/24 0342)  ondansetron  (ZOFRAN ) injection 4 mg (4 mg Intravenous Given 07/01/24 0342)                                    Medical Decision  Making Amount and/or Complexity of Data Reviewed Labs: ordered. Radiology: ordered.  Risk Prescription drug management.   Differential diagnosis considered includes, but not limited to:  Postoperative pain; postoperative cellulitis/abscess; hematoma  Patient presents to the emergency department for evaluation of increased pain, sensation of swelling in the neck and throat.  Patient recently underwent surgery for tumor on the base of his tongue at [facility].  External evaluation is fairly unremarkable.  Surgical incision appears to be in perfect order, no significant hematoma appreciated, wound is healing appropriately, no external signs of infection.  Lungs are clear, no wheezing or clinical signs of pneumonia.  Chest x-ray was unremarkable.  Patient underwent CT scan of neck with contrast.  There is diffuse edema that is likely post procedural.  No fluid collections, hematoma or abscess.  Attempts to contact ENT at [facility] have been unsuccessful.  Unfortunately the patient does not want to wait any longer, asks to be discharged.  He is not in distress.  He reports that he will contact his doctor later.  I have sent images to be able to be viewed by the physicians at [facility].  Will give him a dose of Decadron  before discharge to help with edema.  Empiric antibiotics.  Patient counseled that if he has continued mild symptoms, perhaps it would be better to go to the ED at [facility].  If he has any worsening of his symptoms, he needs to call [phone].     Final diagnoses:  Postoperative pain    ED Discharge Orders          Ordered    amoxicillin -clavulanate (AUGMENTIN ) 400-57 MG/5ML suspension  2 times daily        07/01/24 0518               Williemae Muriel J, MD 07/01/24 [phone]

## 2024-07-01 NOTE — Discharge Instructions (Signed)
 Please get in touch with your surgeon today.  Your labs and the CAT scan should be visible to them -our radiology department has sent the images to [facility] so the images can be reviewed directly.  If you have mild persistent symptoms you might consider going to the emergency department at [facility] if you cannot get in touch with your surgeon.  If you have any significant worsening of your symptoms, call an ambulance.

## 2024-07-11 NOTE — Progress Notes (Signed)
 Speech Language Pathology  Flexible Endoscopic Evaluation of Swallowing (FEES): CPT 07387  Stephen Nguyen 48 y.o.  At the request of Dr. Rudene Gave, a Flexible Endoscopic Evaluation of Swallowing (FEES) was conducted on 07/12/2024. ______  Impression:   Patient presents with ongoing severe pharyngeal dysphagia similar to previous evaluations. Dysphagia remains characterized by decreased bolus propulsion, incomplete airway closure, impaired laryngeal/pharyngeal sensation, and suspected reduced hyolaryngeal excursion/elevation and/or UES distention. Deficits resulted in moderate to severe residue, cord-level penetration across consistencies, and at least aspiration with thins/purees--silent with thins and audible with puree. Observed frequent episodes of at least penetration over interarytenoid space across consistencies, including secretions, though highly suspect aspiration events along posterior aspect of trachea given residue amount/trajectory (see stills under Findings) however this is difficult to visualize on endoscopy and would be better assessed via fluoroscopic examination. Based on today's evaluation, patient should continue full-rate nutrition, hydration, and medications via DHT. May continue water sips/ice chips in moderation for comfort, secretion management, and to mobilize swallow musculature. Patient was educated on risks and adverse outcomes associated with dysphagia/aspiration, impact of thorough oral care-daily and before oral intake, and rationale for adherence to management recommendations and swallow strategies. Given exam results and potential plans for adjuvant treatment, briefly discussed CRT side effects (including potential effects on swallow function and importance of weight maintenance/side effects management during treatment) and considering longer-term feeding tube due to concerns for possible complications with DHT in place for prolonged period of time. Patient  reports plan to re-schedule g-tube placement. Discussed option for a course of swallow therapy however discussed that should adjuvant treatment be pursued then this would need to be deferred until afterwards. Plan for patient to return in 3 weeks for either repeat FEES or CSE with pre-radiation education. Encouraged patient to reach out sooner if he has any questions. Patient and family member verbalized understanding of all exam results and recommendations. Exam results discussed with Dr. Lauralee following the study.    Recommendations: Diet: Full rate nutrition, hydration, medication via DHT. May continue water sips and ice chips in moderation for comfort, secretion management, and to mobilize swallow musculature   Swallow strategies:  sit upright slow rate attention to task perform multiple swallows before taking next sip  cough/clear throat regularly Medications: via alternate means ______  Medical History[1] Surgical History[2] Oncology History   No problem history exists.   Subjective: Pleasant 48 y/o patient presents to outpatient SLP for instrumental evaluation of swallow function. 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  Pertinent History/Prior Swallowing Evaluations:  06/29/2024 MBS: Patient presents with severe-profound pharyngeal dysphagia with aspiration across all consistencies. Oral phase was unremarkable. Swallow initiation was timely. Pharyngeal phase characterized by reduced tongue base retraction, decreased pharyngeal constriction, incomplete epiglottic inversion, reduced hyolaryngeal elevation/excursion, incomplete laryngeal vestibule closure with reduced UES opening. Collectively these deficits resulted in aspiration of all consistencies including thin, mildly thick and moderately thick liquids. Aspiration occurring after the swallow given very minimal UES  opening resulting in severe UES/pyriform residuals with spill over into the trachea. Patient sensate to aspiration triggering reflexive coughing, however not fully effective in clearing aspirated material. Chin tuck, small bolus size and head turn right and left were all attempted without success. Further trials were deferred given severity of swallow.    Based on today's assessment, unable to recommend a safe oral diet at this time. Recommend NPO with alternate means of  nutrition, hydration and medication. Patient may consume ice chips and small sips of water, following thorough oral care in moderation for oral comfort.  07/04/2024 MBS: Patient presents with ongoing severe pharyngeal dysphagia characterized by reduced swallowing efficiency and impaired airway protection with aspiration across consistencies. Impaired bolus propulsion combined with obstruction to bolus flow with incomplete epiglottic inversion resulted in up to severe pharyngeal residuals (severe in base of tongue/valleculae with puree, moderate/diffuse with liquid consistencies). Residuals resulted in aspiration after the swallow as they pooled into the airway with inability to fully clear residuals despite strategy use. Impaired airway closure resulted in penetration during the swallow. Sensation appears mildly diminished, with delayed cough response. Cough was variable in clearing significant amount of material.    Swallowing safety and efficiency are impaired; deficits are not currently well managed with strategy use or diet modification. Recommend consider continue primary nutrition via alternate means of nutrition and provision of ice chips/small sips of water for pharyngeal conditioning following good oral care. Will benefit from ongoing SLP follow up for dysphagia management and initiation of home exercise program.  Dysphagia: Today, patient reports swallowing saliva and water. Endorses a little bit of coughing when he drinks too  fast/much at a time. Denies issues when taking smaller/single sips. Prior to surgery, patient was on regular solids with thin liquids and denied dysphagia. Has been performing Tongue Press, Masako, and Effortful Swallow 10x reps daily. Denies performing Mendelsohn or Supraglottic Swallow. Reports possible plans for radiation at [facility]. Of note, patient reports need to sleep upright 2/2 reflux from tube feeds.    Related Problems: Neck or throat pain: endorses 1/10 left-sided jaw soreness  Chewing/dentition problems: denies  Secretions: endorses thick saliva, sometimes in OC/throat  Reflux: endorses, manages Pepcid every 12hrs   Respiratory/Pulmonary:  Oxygen use: room air, denies issues  History of pneumonia: denies    Nutrition: Current pre-evaluation diet: limited intake of water sips  Feeding tube/use: DHT, continuous feeds (reports cancelling g-tube placement) Weight information: endorses stable since d/c    Wt Readings from Last 10 Encounters:  07/04/24 95 kg (209 lb 6.4 oz)  06/23/24 101 kg (222 lb)  05/31/24 101 kg (222 lb)  05/09/24 99.3 kg (219 lb)  04/29/24 101 kg (222 lb)  04/26/24 101 kg (222 lb)  04/12/24 101 kg (223 lb)  03/29/24 99.8 kg (220 lb)  02/10/24 101 kg (222 lb)  06/25/23 104 kg (230 lb)   ORAL MECHANISM EXAMINATION:  Neck: WFL ROM, bilateral scars  Jaw: WFL  Facial weakness: not observed  Lips: symmetric retraction, adequate seal Tongue strength: WFL laterally, reduced to protrusion  Tongue mobility: WFL, mild deviation R on protrusion  Palate: elevates at midline  DDK rate, rhythm, articulation: WFL Orofacial sensation to light touch: DNT   Oral Health Status Screening: Natural dentition: Y Dentures or plates: N Condition of natural teeth: fair   Oral Hygiene Assessment: Oral secretions/debris: n/a  Home oral care practices: brushes daily  Laryngeal exam: Voice quality description: mildly gurgly, patient endorses a little raspy   Laryngeal elevation: palpated Ability to cough/throat clear: present   Procedure: A flexible endoscope was passed transnasally via the right nare to obtain a superior view of the hypopharynx, larynx, and trachea. Test boluses were administered as indicated below. All boluses were tinged with food coloring for greater visualization and administered to assess swallowing physiology and aspiration risk. The study was recorded.  Test Boluses: Consistencies provided: Thin Liquids, Mildly Thick Liquids, Pureed, and Regular Solid Liquids  provided via: Cup  Findings: Secretion Rating: 3 - Penetration of secretions to true vocal folds and/or tracheal aspiration  Vocal Fold Adduction and Abduction: Complete  Oral Phase: WFL Swallow Initiation Phase: Timely Pharyngeal phase: decreased base of tongue retraction, decreased pharyngeal constriction, incomplete laryngeal vestibule closure Disrupted UES relaxation: Suspected Residue: Moderate - half the bolus remains, Severe - > half the bolus remains, at the base of tongue, in the valleculae, in the pyriform sinus, at the UES/interarytenoid area (thin: moderate // mildly-thick: moderate-severe // puree+solid: severe) Laryngeal Penetration Occurred with: Thin Liquids, Mildly Thick Liquids, Pureed, and Regular Solid Laryngeal Penetration Was: During the swallow, After the swallow Aspiration Occurred With: Thin Liquids and Pureed (?Mildly Thick Liquids and Regular Solid) Aspiration Was: After the swallow Nasal regurgitation: No Esophageal Regurgitation into Hypopharynx: No  Penetration-Aspiration Scale (PAS): Thin Liquid: 8 (3-5/6 with frequent 8 and at least penetration with high suspicion for aspiration over interarytenoid space)  Mildly Thick: 4/?6 (3-4/?6, though at least penetration with high suspicion for aspiration over interarytenoid space)  Puree: 7 (in isolation: 1-3 // mixed with wash: 5-7, with episodes of at least penetration and high  suspicion for aspiration over interarytenoid space)  Solid: 4/?6 (isolation: 1-3 // mixed with wash: 3-4/?6 with at least penetration and high suspicion for aspiration over interarytenoid space)    1 Material does not enter airway  2 Material enters the airway, remains above the vocal folds and is ejected from the airway  3 Material enters the airway, remains above the vocal folds and is not ejected from the airway  4 Material enters the airway, contacts the vocal folds and is ejected from the airway  5 Material enters the airway, contacts the vocal folds and is not ejected from the airway  6 Material enters the airway, passes below the vocal folds and is ejected into the larynx or out of the airway  7 Material enters the airway, passes below the vocal folds and is not ejected from the trachea despite effort  8 Material enters the airway, passes below the vocal folds and no effort is made to eject   Compensatory Strategies Throat Clear/Cough: partially effective Alternate Liquids/Solids: partially effective Multiple Swallows: partially effective Chin tuck: ineffective Head Turn - Right: ineffective Head Turn - Left: ineffective (patient reported subjective improvement) Effortful Swallow: ineffective Other - chin tuck+left head turn: ineffective  Education:  Results of this evaluation were thoroughly discussed with the patient, RN, family (if present), and relayed to MD, PA, or NP following the session.   If you have any questions regarding this patient, please notify Gerard Jaegers, [phone]. Thank you for this referral.  Time In: 1000 Time Out: 1130 Time Calculation:   Charges           07/11/2024   None           [1] Past Medical History: Diagnosis Date  . Adverse effect of anesthesia    foley cath was put in sec to inability to urinate post surgery  . Asthma (CMD) 2020   After Covid 19  . Cancer    (CMD)   . COVID   . Delayed emergence from general  anesthesia   . Hypertension   . Joint pain 2003   Both shoulders  . PTSD (post-traumatic stress disorder)    Several traumatic events  [2] Past Surgical History: Procedure Laterality Date  . APPENDECTOMY  1992  . COLONOSCOPY  2021  . HERNIA REPAIR  1978  . MODIFIED  RADICAL NECK DISSECTION Bilateral 06/23/2024   DISSECTION NECK MODIFIED performed by Fonda Laraine Muscat, MD at [facility] OR  . MOUTH SURGERY  N/A 04/29/2024   BASE OF TONGUE BIOPSY performed by Arthea Maude Fries, MD at [facility] OR  . MOUTH SURGERY  Left 06/25/2024   (E2)CONTROL BLEED TONSIL ADENOID performed by Fonda Laraine Muscat, MD at [facility] OR  . ROTATOR CUFF REPAIR  2005, 2016, 2018  . SHOULDER SURGERY Left    Procedure: SHOULDER SURGERY  . SHOULDER SURGERY Left    Procedure: SHOULDER SURGERY  . THROAT SURGERY Left 06/23/2024   TORS (Transoral robotic surgery) performed by Fonda Laraine Muscat, MD at [facility] OR

## 2024-07-12 NOTE — Progress Notes (Signed)
 History Mr. Stephen Nguyen returns for f/u visit.    He presented with a  left  upper neck mass in fall 2025, some mild sore throat.  Primary tumor identified in left oropharynx on DL, SCCa HPV+         He underwent TORS resection of the left tonsil / tongue base cancer with a bilateral neck dissection on 06/23/24     He had a rough recovery following this.  He had a bleeding episode from the tonsillectomy bed on POD 2, controlled in the OR.   Unable to swallow and required DHT placement.   D/c'd home on POD #7.  Returned to ED on POD 9 with terrible reflux, vomiting, and other symptoms.   Tube advanced to post-pyloric position - he tolerated feeds better and d/c'd home again on POD 12   He had two MBSS postop studies in the hospital, both showed aspiration but significantly improved on the more recent study.   He has been swallowing water, no aspiration.  .   He sees Meghan today      He  has done well since  most recent discharge    He is having mild pain, mainly in the shoulder.  No sore throat .      There have been no problems with the neck wounds.  He denies fevers, drainage, bleeding, or other symptoms.      Voice was "wet" after surgery, seems much-improved currently.            PMH -HTN, PTSD, asthma   Tobacco - denies     Examination  . Vitals: Height 1.753 m (5' 9), weight 94.8 kg (209 lb), SpO2 100%.  . General appearance of patient: healthy and no distress  . Quality of voice: no  hoarseness, no  dysarthria  . Inspection of head and face: Normocephalic, without obvious abnormality, sinuses nontender to percussion  . Left parotid gland: soft, nontender, no swelling, no masses/lesions . Right parotid gland: soft, nontender, no swelling, no masses/lesions . Facial strength: intact, symmetric  . Oral cavity: tongue mobile, FOM soft, mucosa healthy throughout with no masses, lesions, ulcerations  . Oropharynx:  tonsillar fossa healing well ;  no    mucosal ulcerations or masses or other worrisome lesions . Mirror Examination: Attempted but difficult due to gag reflex.   . Neck:  incisions healing nicely, no  palpable lymph nodes.  . Thyroid: normal size, nontender, no nodules palpable      Flexible fiberoptic laryngoscopy -  deferred       Data reviewed   06/23/24 Final Diagnosis  A.  ORAL MUCOSA, LATERAL PHARYNGEAL MARGIN, BIOPSY:              Fibrous issue and skeletal muscle, negative for carcinoma.    B.  ORAL MUCOSA, POSTERIOR TONSIL MARGIN, BIOPSY:              Squamous mucosa, negative for carcinoma.               C.  ORAL MUCOSA, ANTERIOR TONSIL MARGIN, BIOPSY:              Squamous mucosa, fibrous tissue and skeletal muscle, negative for carcinoma.    D.  ORAL MUCOSA, DEEP TONSIL MARGIN, BIOPSY:              Fibrous tissue and skeletal muscle, negative for carcinoma.    E.  ORAL MUCOSA, DISTAL TONGUE BASE MARGIN, BIOPSY:  Fibrous tissue and skeletal muscle, negative for carcinoma.    F.  ORAL MUCOSA, DEEP TONGUE BASE MARGIN, BIOPSY:              Fibrous tissue and skeletal muscle, negative for carcinoma.    G.  ORAL MUCOSA, PROXIMAL TONGUE BASE MARGIN, BIOPSY:              Squamous mucosa, fibrous tissue and skeletal muscle, negative for carcinoma.    H.  ORAL MUCOSA, MEDIAL TONGUE BASE MARGIN, BIOPSY:              Squamous mucosa, fibrous tissue and skeletal muscle, negative for carcinoma.    I.  ORAL CAVITY, LEFT TONSIL AND TONGUE BASE, RESECTION:              Squamous cell carcinoma, HPV-associated.                           Tumor size: 2.8 cm                           Perineural invasion: Negative                           Lymphovascular invasion: Present                           Margins: Negative (tumor <1 mm from deep edge of this specimen, see separate deep margins part D and F)                           Pathologic Stage: pT2 N1.                            See CAP synoptic report.    J.  LEFT NECK CONTENTS, LEVELS 2A, 3 AND 4, DISSECTION:              One of thirty five lymph nodes positive for metastatic carcinoma (1/35)                           Tumor size: 4.0 cm                           Extranodal extension: No definite ENE identified.                            See comment.    K.  LEFT NECK CONTENTS, LEVEL 2B, DISSECTION:              Four lymph nodes, negative for carcinoma (0/4).   L.  RIGHT NECK CONTENTS, LEVEL 2A AND 3, DISSECTION:               Twenty nine lymph nodes, negative for carcinoma (0/29).       Comment   In part J, there is a lymph node markedly involved by squamous cell carcinoma (4.0 cm). This lymph node may be representative of the mattered lymph nodes. No definite extranodal extension is identified. Clinical correlation is recommended.    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 The seperately submitted deep margins are negative.  Closest Margin(s) to Invasive Tumor  deep  REGIONAL LYMPH NODES  Regional Lymph Node Status  Tumor present in regional lymph node(s)  Number of Lymph Nodes with Tumor  1  Laterality of Lymph Node(s)  with Tumor  Ipsilateral (including midline)  Nodal Site(s) with Tumor  Left neck level 2A, 3 or 4  Size of Largest Nodal Metastatic Deposit  4.0 cm  Extranodal Extension (ENE)  Not identified  Number of Lymph Nodes Examined  68  pTNM CLASSIFICATION (AJCC 8th Edition and Version 9)  Reporting of pT, pN, and (when applicable) pM categories is based on information available to the pathologist at the time the report is issued. As per the AJCC (Chapter 1, 8th Ed.) it is the managing physician's responsibility to establish the final pathologic stage based upon all pertinent information, including but potentially not limited to this pathology report.      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  He is s/p TORS resection and bilateral neck dissection  on 06/23/24.  Pathology with 2.8 cm tumor, close but negative margin, +LVI, no PNI, 1/68 nodes (4.0 m, no ENE)  He had a rough postop course but appears to be healing  appropriately  at this time.    We discussed the role of adjuvant therapy in this setting (risk factors include close margin, large node, LVI) to reduce the likelihood of recurrence.   He would like to meet with the radiation oncology team at [facility].        Nutrition - we'll see how the FEES goes today.       Wound care measures were taught to the patient.    Postop NavDx drawn today  . I will call with the results  when they are available to me: [phone]     I will see him  back in ~3 weeks.   He is agreeable with this plan.  All his questions were answered.       cc: Arthea Fries, MD , [name] Cheryl Inks MD, [employer], MD      Postop global: [id number]

## 2024-07-14 ENCOUNTER — Telehealth: Payer: Self-pay | Admitting: Radiation Oncology

## 2024-07-14 ENCOUNTER — Inpatient Hospital Stay
Admission: RE | Admit: 2024-07-14 | Discharge: 2024-07-14 | Disposition: A | Payer: Self-pay | Source: Ambulatory Visit | Attending: Radiation Oncology | Admitting: Radiation Oncology

## 2024-07-14 ENCOUNTER — Other Ambulatory Visit: Payer: Self-pay | Admitting: Radiation Oncology

## 2024-07-14 DIAGNOSIS — C108 Malignant neoplasm of overlapping sites of oropharynx: Secondary | ICD-10-CM

## 2024-07-14 NOTE — Telephone Encounter (Signed)
 12/4 Sent via stat fax request for recent PET/CT images to be push to [facility]) from [employer].  Waiting on images.

## 2024-07-14 NOTE — Telephone Encounter (Signed)
 12/4 @ 3:40 pm Called spoke to patient, he prefer to use [facility] authorization for all appt here in [facility] appts.  Reach out to Dr. Helmut office, to send a request for services (RFS) form to [facility], to send Auth before schedule consult.

## 2024-07-15 ENCOUNTER — Telehealth: Payer: Self-pay | Admitting: Radiation Oncology

## 2024-07-15 NOTE — Telephone Encounter (Signed)
 12/5 Left voicemail with [employer] for recent PET/CT images from [facility] to be uploaded to patient's timeline in Epic.  Waiting on images.  12/9 Follow up call to [facility] spoke to [place], working on it.  12/10 put in ticket to IT, may need hyperlink created.

## 2024-07-19 ENCOUNTER — Other Ambulatory Visit: Payer: Self-pay | Admitting: Radiation Oncology

## 2024-07-19 DIAGNOSIS — C01 Malignant neoplasm of base of tongue: Secondary | ICD-10-CM

## 2024-07-19 NOTE — Progress Notes (Incomplete)
 Head and Neck Cancer Location of Tumor / Histology:  Malignant neoplasm of base of tongue  Squamous cell carcinoma of overlapping sites of oropharynx   Patient presented months ago with symptoms of:  He presented with a left upper neck mass and some mild sore throat  Biopsies revealed:  07/01/2024 CT Soft Tissue Neck with Contrast    Nutrition Status Yes No Comments  Weight changes? [x]  []  30 pounds weight loss since surgery this past November.  Swallowing concerns? [x]  []    PEG? [x]  []     Referrals Yes No Comments  Social Work? [x]  []    Dentistry? [x]  []    Swallowing therapy? [x]  []    Nutrition? [x]  []    Med/Onc? [x]  []     Safety Issues Yes No Comments  Prior radiation? []  [x]    Pacemaker/ICD? []  [x]    Possible current pregnancy? []  [x]    Is the patient on methotrexate? []  [x]     Tobacco/Marijuana/Snuff/ETOH use: None Past/Anticipated interventions by medical oncology, if any:   Past/Anticipated interventions by otolaryngology, if any:  07/12/2024 Lauralee, MD       Current Complaints / other details:  None

## 2024-07-20 ENCOUNTER — Other Ambulatory Visit: Payer: Self-pay

## 2024-07-20 ENCOUNTER — Inpatient Hospital Stay
Admission: RE | Admit: 2024-07-20 | Discharge: 2024-07-20 | Disposition: A | Discharge status: Home / Self Care | Payer: Self-pay | Source: Ambulatory Visit | Attending: Radiation Oncology | Admitting: Radiation Oncology

## 2024-07-20 ENCOUNTER — Telehealth: Payer: Self-pay | Admitting: Radiation Oncology

## 2024-07-20 DIAGNOSIS — C760 Malignant neoplasm of head, face and neck: Secondary | ICD-10-CM

## 2024-07-20 NOTE — Telephone Encounter (Signed)
 12/10 Follow up call to [employer] spoke to [employer] after received message from IT Dept. Hyperlink created for images, but not seeing them to attached.  At this time images are being validated, will upload once complete.

## 2024-07-22 NOTE — Discharge Summary (Signed)
 "  Hospital Medicine Discharge Summary   Demographics: Stephen Nguyen  48 y.o. September 05, 1975 MRN: [id number]    Extended Emergency Contact Information Primary Emergency Contact: Velez,[name] Mobile Phone: [phone] Relation: Spouse  Full Code  Admit Date: 07/20/2024                            Attending Physician: Gini Dennise Pinto, MD Discharge Date: 07/22/2024  Primary Care Provider: [employer], MD   [phone]  Consults during this admission: Consult Orders     None      Active & Resolved Diagnosis: Principal Problem:   Protein calorie malnutrition (CMD) Resolved Problems:   * No resolved hospital problems. *  Disposition: Patient discharged to Home in stable condition.  Discharge follow-up recommendations:   1 [facility] for continued outpatient management.    Scheduled Future Appointments       Provider Department Dept Phone Center   08/02/2024 9:00 AM Fonda Rash [facility] - MPM ENT [phone] [facility] MP [facility]   08/02/2024 10:00 AM Meghan [facility] - MPM Outpatient Speech Language Pathology [phone] [facility] MP [place]   08/02/2024 10:00 AM SLP MPM 02 FEES RM 3 [facility] - MPM Outpatient Speech Language Pathology [phone] [facility] [place]   08/16/2024 2:45 PM Elveria Jenkins Miyamoto; [facility] GI2 [facility] - Outpatient Speech Language Pathology Germaine Balls [phone] [facility] Course: Briefly,  48 y.o. male with a PMH significant for HTN, GERD, migraines, syncope, oropharyngeal SCC presented as a direct admit from IR after GJ tube placement for post procedural observation. He was started on as needed analgesics and anti-emetics. Tube was kept to gravity for 24 hours after the procedure and patient NPO. IV fluids placed on maintenance and required prn iv dialudid for insertion site pain. Labs/vitals  monitored and serial abdominal exam stable. KUB: Nonobstructive bowel gas pattern. Enteric contrast material is present in the large bowel and fundus of the stomach.GJ tubing projects over left upper quadrant/epigastrium. RD was consulted for Moderate protein-calorie malnutrition POA. He was started on J tube feeding and tolerating at time of discharge. No new complains on day of discharge and felt ready to go home. Appreciate case manager's assistance, resumption of tube feeding via the [place] in outpatient. Return to ED instructions and red flag signs reviewed with the patient.   Wound / Incision Assessment: Refer to Chart Review and Media Tab for images if available.  Wound (Active)  No Date First Assessed or Time First Assessed found.   Pre-Existing Wound: No     Wound 06/23/24 Incision Mouth (Active)  Date First Assessed: 06/23/24   Primary Wound Type: Incision  Location: Mouth     Wound 06/23/24 Incision Neck Left (Active)  Date First Assessed/Time First Assessed: 06/23/24 1634   Pre-Existing Wound: No  Primary Wound Type: Incision  Location: Neck  Wound Location Orientation: Left     Wound 06/23/24 Incision Neck Right (Active)  Date First Assessed/Time First Assessed: 06/23/24 1752   Pre-Existing Wound: No  Primary Wound Type: Incision  Location: Neck  Wound Location Orientation: Right     Wound 06/25/24 Incision Mouth (Active)  Date First Assessed: 06/25/24   Pre-Existing Wound: Yes  Primary Wound Type: Incision  Location: Mouth  Wound Description (Comments): Tonsil bleed.     Wound  07/21/24 Incision Abdomen Left;Lower (Active)  Date First Assessed/Time First Assessed: 07/21/24 0800   Primary Wound Type: Incision  Location: Abdomen  Wound Location Orientation: Left;Lower    Vital Sign Range:  Temp:  [97.6 F (36.4 C)-98.5 F (36.9 C)] 98 F (36.7 C) Heart Rate:  [81-97] 89 Resp:  [16-18] 16 BP: (110-127)/(62-79) 124/75      Discharge Medications     PAUSE taking these  medications      Sig Disp Refill Start End  amLODIPine 5 mg tablet Wait to take this until your doctor or other care provider tells you to start again. Commonly known as: NORVASC  Take 1 tablet by mouth daily.   0         New Medications      Sig Disp Refill Start End  acetaminophen  500 mg tablet Commonly known as: TYLENOL   Take 1 tablet (500 mg total) by mouth every 6 (six) hours as needed for moderate pain (4-6).   0         Medications To Continue      Sig Disp Refill Start End  albuterol  HFA 90 mcg/actuation inhaler Commonly known as: PROVENTIL  HFA;VENTOLIN  HFA;PROAIR  HFA  Inhale as needed.   0     famotidine 20 mg tablet Commonly known as: PEPCID  Take 1 tablet (20 mg total) by mouth 2 (two) times a day.  180 tablet  0     Nutren 1.5 0.07 gram-1.5 kcal/mL Liqd Generic drug: nutritional supplements  Take 1 Can by mouth 6 (six) times a day. for nutritional supplement (bolus by syringe); rinse with 90ml water  before and after each carton; keep elevated between 30-40 degrees; allowed food/fluid as directed by provider; check residuals as directed by provider   0     sildenafiL 100 mg tablet Commonly known as: VIAGRA  Take 100 mg by mouth as needed.   0     sucralfate 1 gram tablet Commonly known as: CARAFATE  Take 1 tablet (1 g total) by mouth 4 (four) times a day before meals and nightly.  360 tablet  0     SUMAtriptan 50 mg tablet Commonly known as: IMITREX  Take 50 mg by mouth as needed.   0         Lab Results  Component Value Date/Time   HGB 13.4 (L) 07/21/2024 08:36 AM   HCT 38.9 (L) 07/21/2024 08:36 AM   WBC 9.00 07/21/2024 08:36 AM   PLT 406 07/21/2024 08:36 AM   Lab Results  Component Value Date/Time   NA 139 07/22/2024 07:39 AM   K 4.0 07/22/2024 07:39 AM   CREATININE 0.91 07/22/2024 07:39 AM   BUN 19 07/22/2024 07:39 AM   GLUCOSE 90 07/22/2024 07:39 AM    Pertinent Imaging: XR Abdomen 1 View  Final Result by Ozell Govern,  MD (12/11 1329)  XR ABDOMEN 1 VIEW, 07/20/2024 7:27 PM     INDICATION: abdominal pain, Nutritional deficiency, unspecified \ E63.9   Nutritional deficiency, unspecified   COMPARISON: Abdominal x-ray 07/05/2024    TECHNIQUE: Supine radiograph(s) of the abdomen were obtained.    FINDINGS:    . Gastrointestinal tract: Nonobstructive bowel gas pattern. Enteric   contrast material is present in the large bowel and stomach.  . Calcifications: No apparent pathologic calcification.  . Musculoskeletal: No interval osseous abnormality.  . Lower chest: No acute process.  . Lines/Tubes/Surgical: GJ tubing projects over left upper   quadrant/epigastrium.    IMPRESSION:  1.  Nonobstructive  bowel gas pattern. Enteric contrast material is present   in the large bowel and fundus of the stomach.  2.  GJ tubing projects over left upper quadrant/epigastrium.          IR G/J Tube Insert  Final Result by Ozell [name] Pinal, MD (12/11 2105)  IR G/J TUBE INSERT, 07/20/2024 3:26 PM    INDICATION & HISTORY: Nutritional deficiency, unspecified \ E63.9   Nutritional deficiency, unspecified ,    ATTENDING PHYSICIAN: Ozell Pinal, MD    RESIDENT PHYSICIAN: Gustavo Blanch, MD    APP: None.    PROCEDURE: Percutaneous gastrojejunostomy tube placement using   push-through technique.    FINDINGS:  1.  Contrast injection confirms proper positioning of the gastropexy   T-fasteners.  2.  Post-placement contrast injection of gastric and jejunal ports of the   gastrojejunostomy tube confirms appropriate positioning of tube      IMPRESSION:  CONCLUSION:  Successful placement of a 18 Fr x 45 cm GJ tube.      PLAN:  1.  The patient is to remain NPO for 24 hours, with the G lumen set to   gravity drainage.  2.  The T-fasteners should fall off on their own. If not, they can be   removed in 14 days.        DETAILS OF PROCEDURE:    Informed consent was obtained after a discussion of the risks,  benefits,   and alternatives. The skin overlying the epigastrium was prepped and   draped in the usual sterile fashion. Maximum sterile barriers were used,   including cap, mask, hand hygiene, sterile gloves, sterile gown, large   sterile drape, and 2% chlorhexidine for cutaneous antisepsis. A   standardized time-out was then performed in accordance with the Universal   Protocol guidelines to confirm the correct patient, operative site, and   procedure.    The stomach was insufflated with air from a nasogastric tube. An   appropriate site for gastric puncture was selected overlying the gastric   body. The skin overlying this site was anesthetized with 1% lidocaine .   Under fluoroscopic guidance, gastropexy was obtained with 3 T-fasteners.   Placement of the facets was verified with contrast injections. A 1 cm skin   incision was made central to the fasteners. Under direct fluoroscopic   guidance, a 18-gauge trocar needle was passed through the incision into   the stomach lumen. Needle position was verified by contrast injection. A   stiff Glidewire was advanced through the needle into the stomach. The   needle was then exchanged for a Berenstein catheter. The catheter and wire   were then used to obtain access to the small bowel. The wire was placed in   the proximal jejunum. The catheter was removed. A peel-away sheath was   then introduced. Serial dilation of the gastrostomy was carried out until   an 25 French by 45 cm gastrojejunostomy tube could be   accommodated. The tube was advanced over the wire until the tip was in the   proximal jejunum. The retention balloon was filled with 10 mL of dilute   contrast and the external disc was cinched to the skin. Contrast injection   of the G and J lumens verify appropriate positions.    COMPLICATIONS: No immediate complications. The patient tolerated the   procedure well and left the interventional radiology suite in stable   condition.     ESTIMATED BLOOD LOSS: Less than 10 mL.  CONTRAST USED: 40 mL Omnipaque .    FLUOROSCOPY TIME / CUMULATIVE DOSE: 7.6 minutes / 49 mGy.     SEDATION: Anesthesia support was used for this procedure. Please refer to   the separate anesthesia documentation for type and medications   administered.    ANTIBIOTICS: See separate Nursing/Anesthesia documentation.        *Structured report code: IR.[email or web address]     Electronically signed by: Gini Dennise Pinto, MD 07/22/2024 1:41 PM   "

## 2024-07-22 NOTE — Progress Notes (Signed)
 Case Management Discharge Note        CSN: [id number] DOB: Nov 09, 1975 Service: General Medicine Location: C516/A  Patient Class: Inpatient  DC Disposition: : Home or Self Care  Discharge DC Disposition: : Home or Self Care Infusion Referral : Enteral ([facility] with [facility]) Infusion Referral Agency(s) Chosen: Other ([facility] tube feeding via [facility]) Patient DME Agency: Other (Comment) ([facility] for [facility] - tube feeding pump, pole, formula, and supplies)  Discharge Referrals Patient Preference: Chosen geographical local area/county shared with patient/family: Return/previous involvement Patient Preference for Post-Acute Provider Form completed: Return/Previous Involvement Case closed, patient/family agree with disposition plan: Yes           April  O Oody, RN

## 2024-07-24 NOTE — Progress Notes (Signed)
 Radiation Oncology         [phone]) [phone] ________________________________  Initial Outpatient Consultation  Name: Stephen Nguyen MRN: [id number]  Date: 07/25/2024  DOB: 1976/02/24  CC:Stephen Righter, MD  Lauralee Chew, MD   REFERRING PHYSICIAN: Lauralee Chew, MD  DIAGNOSIS: No diagnosis found.   Cancer Staging  No matching staging information was found for the patient.  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 male who presented to medical attention this past August with a 2 month history of worsening left anterior cervical lymphadenopathy. He accordingly presented for a soft tissue head and neck ultrasound on 03/31/24 which demonstrated a largely hypoechoic enlarged lymph node measuring 2.6 x 2.1 x 1.6, with a thin fatty hilum. An additional lymph node was also demonstrated posteriorly measuring 1.6 x 0.3 x 1.3 cm, without concerning features.   Subsequently, the patient was referred to Dr. Paul at [facility] on 04/12/24. A laryngoscopy was performed at that time which further revealed a 1 cm exophytic mass at the left tongue base.  A biopsy of the left neck lymph node was the obtained on 04/21/24. Pathology showed atypical cells presented suspicious for carcinoma.    To obtain a definitive histology, a biopsy of the left tongue base mass was then obtained on 04/29/24 which showed HPV associated nonkeratinizing squamous cell carcinoma.   Further imaging was obtained including a PET scan on 05/20/24 which demonstrated: the hypermetabolic left tongue base mass consistent with the known site of primary malignancy, and the hypermetabolic left level 2A lymph nodes consistent with  metastatic disease. No other additional sites concerning for FDG avid metastatic disease were demonstrated otherwise.   A soft tissue neck CT was also obtained on 05/27/24 which showed a left palatine tonsil mass measuring at least 17 mm, and the known left level 2 nodal metastasis measuring 3.4 cm. No evidence of extranodal extension was demonstrated otherwise.   Her was then referred to Dr. Lauralee and opted to proceed with resection of the left tonsil and tongue base and cervical lymph node dissections on 06/23/24. Pathology from the procedure revealed:  -- Left tonsil and tongue base resection: tumor the size of 2.8 cm; histology of HPV associated squamous cell carcinoma; negative for PNI; positive for LVI; all margins negative; margin status to disease of <1 mm from the deep edge. -- Left neck levels 2A, 3, and 4 dissection: 01/35 lymph nodes positive for metastatic carcinoma, with the positive node measuring up to 4.0 cm in size.  -- Left neck level 2B lymph nodes dissection: 04/04 lymph nodes negative for carcinoma  -- Right level 2A and 3 lymph nodes dissection: 29/29 lymph nodes negative for carcinoma.   After his procedure, the patient presented to the ED on 07/01/24 with increased difficulty breathing which he attributed to throat swelling. Physical exam performed was unremarkable and a CT scan of the neck was obtained that showed diffuse edema that was likely procedural. No fluid collections, hematoma, or evidence of abscess were appreciated otherwise. Per ED encounter notes, attempts were made to contact his provider at [facility]. Attempts were however unsuccessful and he opted to be discharged home. Of note: he was sent home with Decadron  and empiric abx.   He however returned to  the ED and was admitted that same day due to trouble swallowing. Per Dr. Helmut notes, he required DHT placement at that time. He also developed severe acid reflux and vomiting. Hospital encounter notes are  otherwise unavailable for review.    Swallowing issues, if any: trouble swallowing after his procedure on 07/01/24 (also presented with left anterior cervical lymphadenopathy).   Weight Changes: ***  Pain status: ***  Other symptoms: throat swelling (after procedure)   Tobacco history, if any: none  ETOH abuse, if any: none  Prior cancers, if any: none  PREVIOUS RADIATION THERAPY: No  PAST MEDICAL HISTORY:  has a past medical history of GERD (gastroesophageal reflux disease), Hypertension, and Migraines.    PAST SURGICAL HISTORY: Past Surgical History:  Procedure Laterality Date   APPENDECTOMY     dental implant     HERNIA REPAIR     childhood   ROTATOR CUFF REPAIR     Left   WISDOM TOOTH EXTRACTION      FAMILY HISTORY: family history includes Asthma in his father; Cancer in his father, mother, and paternal grandmother; Prostate cancer in his paternal grandfather.  SOCIAL HISTORY:  reports that he has never smoked. He has never used smokeless tobacco. He reports that he does not drink alcohol and does not use drugs.  ALLERGIES: Patient has no known allergies.  MEDICATIONS:  Current Outpatient Medications  Medication Sig Dispense Refill   acetaminophen  (TYLENOL ) 325 MG tablet Take 2 tablets (650 mg total) by mouth every 6 (six) hours as needed for mild pain or fever (or Fever >/= 101).     albuterol  (VENTOLIN  HFA) 108 (90 Base) MCG/ACT inhaler Inhale 2 puffs into the lungs every 4 (four) hours as needed for wheezing or shortness of breath. 18 g 11   COVID-19 mRNA bivalent vaccine, Pfizer, (PFIZER COVID-19 VAC BIVALENT) injection Inject into the muscle. 0.3 mL 0   Fluticasone -Salmeterol (ADVAIR DISKUS) 250-50 MCG/DOSE AEPB Inhale 1 puff into the lungs 2 (two) times daily. 180 each 3   influenza vac split quadrivalent PF (FLUARIX  QUADRIVALENT) 0.5 ML injection Inject into the muscle. 0.5 mL 0   metoprolol  tartrate (LOPRESSOR ) 25 MG tablet Take 25 mg by mouth 2 (two) times  daily.     Multiple Vitamin (ONE-A-DAY MENS PO) Take 1 tablet by mouth daily.     omeprazole  (PRILOSEC) 40 MG capsule Take 40 mg by mouth daily as needed for heartburn.     SUMAtriptan (IMITREX) 50 MG tablet Take 50 mg by mouth every 2 (two) hours as needed for migraine. May repeat in 2 hours if headache persists or recurs.     verapamil  (CALAN -SR) 120 MG CR tablet Take 120 mg by mouth at bedtime.     vitamin C  (ASCORBIC ACID) 500 MG tablet Take 1 tablet (500 mg total) by mouth 2 (two) times daily. 15 tablet 0   No current facility-administered medications for this encounter.    REVIEW OF SYSTEMS:  Notable for that above.   PHYSICAL EXAM:  vitals were not taken for this visit.   General: Alert and oriented, in no acute distress HEENT: Head is normocephalic. Extraocular movements are intact. Oropharynx is notable for ***. Neck: Neck is notable for *** Heart: Regular in rate and rhythm with no murmurs, rubs, or gallops. Chest: Clear to auscultation bilaterally, with no rhonchi, wheezes, or rales. Abdomen: Soft, nontender, nondistended, with no rigidity or guarding. Extremities: No cyanosis or edema. Lymphatics: see Neck Exam Skin: No concerning lesions. Musculoskeletal: symmetric strength  and muscle tone throughout. Neurologic: Cranial nerves II through XII are grossly intact. No obvious focalities. Speech is fluent. Coordination is intact. Psychiatric: Judgment and insight are intact. Affect is appropriate.   ECOG = ***  0 - Asymptomatic (Fully active, able to carry on all predisease activities without restriction)  1 - Symptomatic but completely ambulatory (Restricted in physically strenuous activity but ambulatory and able to carry out work of a light or sedentary nature. For example, light housework, office work)  2 - Symptomatic, <50% in bed during the day (Ambulatory and capable of all self care but unable to carry out any work activities. Up and about more than 50% of waking  hours)  3 - Symptomatic, >50% in bed, but not bedbound (Capable of only limited self-care, confined to bed or chair 50% or more of waking hours)  4 - Bedbound (Completely disabled. Cannot carry on any self-care. Totally confined to bed or chair)  5 - Death   Raylene MM, Creech RH, Tormey DC, et al. [phone]). Toxicity and response criteria of the [facility]. Am. DOROTHA Bridges. Oncol. 5 (6): 649-55   LABORATORY DATA:  Lab Results  Component Value Date   WBC 10.6 (H) 07/01/2024   HGB 13.5 07/01/2024   HCT 38.8 (L) 07/01/2024   MCV 88.8 07/01/2024   PLT 373 07/01/2024   CMP     Component Value Date/Time   NA 134 (L) 07/01/2024 0200   K 4.8 07/01/2024 0200   CL 97 (L) 07/01/2024 0200   CO2 26 07/01/2024 0200   GLUCOSE 112 (H) 07/01/2024 0200   BUN 16 07/01/2024 0200   CREATININE 0.96 07/01/2024 0200   CALCIUM 9.5 07/01/2024 0200   PROT 7.3 04/25/2022 0927   ALBUMIN 4.8 04/25/2022 0927   AST 30 04/25/2022 0927   ALT 46 (H) 04/25/2022 0927   ALKPHOS 77 04/25/2022 0927   BILITOT 0.7 04/25/2022 0927   GFRNONAA >60 07/01/2024 0200   GFRAA >60 11/26/2018 0442      No results found for: TSH   RADIOGRAPHY: CT Soft Tissue Neck W Contrast Result Date: 07/01/2024 EXAM: CT NECK WITH CONTRAST 07/01/2024 02:57:49 AM TECHNIQUE: CT of the neck was performed with the administration of intravenous contrast. Multiplanar reformatted images are provided for review. Automated exposure control, iterative reconstruction, and/or weight based adjustment of the mA/kV was utilized to reduce the radiation dose to as low as reasonably achievable. COMPARISON: None available. CLINICAL HISTORY: Recent head and neck surgery, trouble swallowing FINDINGS: AERODIGESTIVE TRACT: Edematous supraglottic larynx , tongue base and oropharyngeal walls without discrete drainable fluid collection. SALIVARY GLANDS: The parotid and submandibular glands are unremarkable. THYROID: Unremarkable. LYMPH NODES:  Left neck dissection. No adenopathy. SOFT TISSUES: No mass or fluid collection. BRAIN, ORBITS, SINUSES AND MASTOIDS: No acute abnormality. LUNGS AND MEDIASTINUM: No acute abnormality. BONES: No focal bone abnormality. IMPRESSION: 1. Edematous supraglottic larynx , tongue base and oropharyngeal walls without discrete drainable fluid collection. Given known recent surgery, findings are likely in part postoperative but infection is not excluded. 2. No priors available for comparison. Recommend follow-up CT neck with contrast after resolution of acute findings to establish a baseline and exclude residual tumor. 3. Left neck dissection. No adenopathy. Electronically signed by: Gilmore Molt MD 07/01/2024 03:07 AM EST RP Workstation: [id number]   DG Chest 2 View Result Date: 07/01/2024 EXAM: 2 VIEW(S) XRAY OF THE CHEST 07/01/2024 01:36:00 AM COMPARISON: Chest x-ray 11/25/2018 CLINICAL HISTORY: SOB. FINDINGS: LINES, TUBES AND DEVICES: Weighted enteric tube with  tip overlying the stomach. LUNGS AND PLEURA: No focal pulmonary opacity. No pleural effusion. No pneumothorax. HEART AND MEDIASTINUM: No acute abnormality of the cardiac and mediastinal silhouettes. BONES AND SOFT TISSUES: No acute osseous abnormality. IMPRESSION: 1. Weighted enteric tube with tip projecting over the stomach. Electronically signed by: Morgane Naveau MD 07/01/2024 01:58 AM EST RP Workstation: [id number]      IMPRESSION/PLAN:  This is a delightful patient with head and neck cancer. I *** recommend radiotherapy for this patient.  We discussed the potential risks, benefits, and side effects of radiotherapy. We talked in detail about acute and late effects. We discussed that some of the most bothersome acute effects may be mucositis, dysgeusia, salivary changes, skin irritation, hair loss, dehydration, weight loss and fatigue. We talked about late effects which include but are not necessarily limited to dysphagia, hypothyroidism, nerve  injury, vascular injury, spinal cord injury, xerostomia, trismus, neck edema, dental issues, non-healing wound, and potentially fatal injury to any of the tissues in the head and neck region. No guarantees of treatment were given. A consent form was signed and placed in the patient's medical record. The patient is enthusiastic about proceeding with treatment. I look forward to participating in the patient's care.    Simulation (treatment planning) will take place ***  We also discussed that the treatment of head and neck cancer is a multidisciplinary process to maximize treatment outcomes and quality of life. For this reason the following referrals have been or will be made:  *** Medical oncology to discuss chemotherapy   *** Dentistry for dental evaluation, possible extractions in the radiation fields, and /or advice on reducing risk of cavities, osteoradionecrosis, or other oral issues.  *** Nutritionist for nutrition support during and after treatment.  *** Speech language pathology for swallowing and/or speech therapy.  *** Social work for social support.   *** Physical therapy due to risk of lymphedema in neck and deconditioning.  *** Baseline labs including TSH.  On date of service, in total, I spent *** minutes on this encounter. Patient was seen in person.  __________________________________________   Lauraine Golden, MD  This document serves as a record of services personally performed by Lauraine Golden, MD. It was created on her behalf by Dorthy Fuse, a trained medical scribe. The creation of this record is based on the scribe's personal observations and the provider's statements to them. This document has been checked and approved by the attending provider.

## 2024-07-25 ENCOUNTER — Other Ambulatory Visit: Payer: Self-pay

## 2024-07-25 ENCOUNTER — Ambulatory Visit
Admission: RE | Admit: 2024-07-25 | Discharge: 2024-07-25 | Attending: Radiation Oncology | Admitting: Radiation Oncology

## 2024-07-25 ENCOUNTER — Encounter: Payer: Self-pay | Admitting: Radiation Oncology

## 2024-07-25 ENCOUNTER — Other Ambulatory Visit: Payer: Self-pay | Admitting: Radiation Oncology

## 2024-07-25 ENCOUNTER — Ambulatory Visit: Admission: RE | Admit: 2024-07-25 | Discharge: 2024-07-25 | Attending: Radiation Oncology

## 2024-07-25 VITALS — BP 141/93 | HR 88 | Temp 97.6°F | Resp 18 | Ht 69.5 in | Wt 205.0 lb

## 2024-07-25 DIAGNOSIS — R1313 Dysphagia, pharyngeal phase: Secondary | ICD-10-CM

## 2024-07-25 DIAGNOSIS — C108 Malignant neoplasm of overlapping sites of oropharynx: Secondary | ICD-10-CM

## 2024-07-25 DIAGNOSIS — Z79899 Other long term (current) drug therapy: Secondary | ICD-10-CM | POA: Diagnosis not present

## 2024-07-25 DIAGNOSIS — C01 Malignant neoplasm of base of tongue: Secondary | ICD-10-CM

## 2024-07-25 DIAGNOSIS — C779 Secondary and unspecified malignant neoplasm of lymph node, unspecified: Secondary | ICD-10-CM | POA: Diagnosis not present

## 2024-07-25 DIAGNOSIS — Z809 Family history of malignant neoplasm, unspecified: Secondary | ICD-10-CM | POA: Diagnosis not present

## 2024-07-25 DIAGNOSIS — B977 Papillomavirus as the cause of diseases classified elsewhere: Secondary | ICD-10-CM | POA: Diagnosis not present

## 2024-07-25 DIAGNOSIS — R1310 Dysphagia, unspecified: Secondary | ICD-10-CM | POA: Diagnosis not present

## 2024-07-25 DIAGNOSIS — K219 Gastro-esophageal reflux disease without esophagitis: Secondary | ICD-10-CM | POA: Diagnosis not present

## 2024-07-25 DIAGNOSIS — Z8042 Family history of malignant neoplasm of prostate: Secondary | ICD-10-CM | POA: Diagnosis not present

## 2024-07-25 MED ORDER — OMEPRAZOLE 2 MG/ML ORAL SUSPENSION: 40.0000 mg | Freq: Every day | ORAL | 2 refills | Status: DC

## 2024-07-25 NOTE — Progress Notes (Signed)
 Oncology Nurse Navigator Documentation   Met with patient during initial consult with Stephen Nguyen. He was accompanied by his wife, [name].  Further introduced myself as his/their Navigator, explained my role as a member of the Care Team. Provided New Patient resource guide binder: Contact information for physicians, this navigator, other members of the Care Team Advance Directive information; provided [facility] AD booklet at their request,  Fall Prevention Patient Safety Plan Financial Assistance Information sheet Symptom Management Clinic information [facility] map with highlight of [facility] SLP Information sheet Head and Neck cancer basics Nutrition information Patient and family support information including Spiritual care/Chaplain information, Peer mentor program, health and wellness classes, and the survivorship program Community resources  Assisted with post-consult appt scheduling. I have started a community care request for [name] to see Dr. Danial, Dentist at [facility] dentistry to see him for dental clearance and SPD fabrication.  They verbalized understanding of information provided. I encouraged them to call with questions/concerns moving forward.  Delon Jefferson, RN, BSN, OCN Head & Neck Oncology Nurse Navigator [facility] [phone]

## 2024-07-27 ENCOUNTER — Encounter: Payer: Self-pay | Admitting: Radiation Oncology

## 2024-07-27 ENCOUNTER — Inpatient Hospital Stay: Attending: Radiation Oncology

## 2024-07-27 NOTE — Progress Notes (Signed)
 Dental Form with Estimates of Radiation Dose  Stephen Nguyen Date of birth: Aug 16, 1975         Cancer Staging  Malignant neoplasm of base of tongue (HCC) Staging form: Pharynx - HPV-Mediated Oropharynx, AJCC 8th Edition - Pathologic stage from 07/25/2024: Stage I (pT2, pN1, cM0, p16+) - Signed by Izell Domino, MD on 07/25/2024 Stage prefix: Initial diagnosis   Prognosis: curative  Anticipated # of fractions: 30    Daily?: yes  # of weeks of radiotherapy: 6  Chemotherapy?: no  Anticipated xerostomia:  Mild permanent   Pre-simulation needs:   Scatter protection as appropriate / Other (pre-RT clearance/advice)  Simulation: ASAP    Other Notes:   Please contact Domino Izell, MD, with patient's disposition after evaluation and/or dental treatment. -----------------------------------  Domino Izell, MD

## 2024-07-27 NOTE — Progress Notes (Signed)
 [facility] Clinical Social Work  Clinical Social Work was referred by medical provider for emotional support.  Clinical Social Worker contacted patient by phone to offer support and assess for needs.  Clinical Social Worker discussed available supports (peer mentor,clinical counseling). Patient has informal supports available to him that have been through head and neck treatment. Patient agreed to referral to [facility] Peer Mentor Program.   Lizbeth Sprague, LCSW  Clinical Social Worker [facility]

## 2024-07-28 NOTE — Progress Notes (Signed)
 Oncology Nurse Navigator Documentation   Dr. Izell has requested pre-dental evaluation on Stephen Nguyen before radiation treatment to his head and neck area begins. I have sent a request to the [facility] for a community care dental evaluation. I will follow for a response back from the [place] before scheduling him with Dr. Danial of [facility]. Mr. Kelty is aware of the referral and [facility]. He has my direct contact information to call me if he has any questions or concerns.  Delon Jefferson RN, BSN, OCN Head & Neck Oncology Nurse Navigator [facility] Phone # [phone]  Fax # [phone]

## 2024-08-05 ENCOUNTER — Other Ambulatory Visit: Payer: Self-pay

## 2024-08-05 ENCOUNTER — Emergency Department (HOSPITAL_COMMUNITY)

## 2024-08-05 ENCOUNTER — Inpatient Hospital Stay (HOSPITAL_COMMUNITY)
Admission: EM | Admit: 2024-08-05 | Discharge: 2024-08-08 | DRG: 177 | Disposition: A | Discharge status: Home / Self Care | Attending: Internal Medicine | Admitting: Internal Medicine

## 2024-08-05 DIAGNOSIS — Z825 Family history of asthma and other chronic lower respiratory diseases: Secondary | ICD-10-CM | POA: Diagnosis not present

## 2024-08-05 DIAGNOSIS — Z8042 Family history of malignant neoplasm of prostate: Secondary | ICD-10-CM | POA: Diagnosis not present

## 2024-08-05 DIAGNOSIS — C7801 Secondary malignant neoplasm of right lung: Secondary | ICD-10-CM | POA: Diagnosis present

## 2024-08-05 DIAGNOSIS — R0602 Shortness of breath: Secondary | ICD-10-CM | POA: Diagnosis present

## 2024-08-05 DIAGNOSIS — J9601 Acute respiratory failure with hypoxia: Principal | ICD-10-CM | POA: Diagnosis present

## 2024-08-05 DIAGNOSIS — G43909 Migraine, unspecified, not intractable, without status migrainosus: Secondary | ICD-10-CM | POA: Diagnosis present

## 2024-08-05 DIAGNOSIS — R042 Hemoptysis: Secondary | ICD-10-CM

## 2024-08-05 DIAGNOSIS — Z1152 Encounter for screening for COVID-19: Secondary | ICD-10-CM | POA: Diagnosis not present

## 2024-08-05 DIAGNOSIS — K219 Gastro-esophageal reflux disease without esophagitis: Secondary | ICD-10-CM | POA: Diagnosis present

## 2024-08-05 DIAGNOSIS — Z7951 Long term (current) use of inhaled steroids: Secondary | ICD-10-CM

## 2024-08-05 DIAGNOSIS — J690 Pneumonitis due to inhalation of food and vomit: Principal | ICD-10-CM | POA: Diagnosis present

## 2024-08-05 DIAGNOSIS — R0902 Hypoxemia: Principal | ICD-10-CM

## 2024-08-05 DIAGNOSIS — M546 Pain in thoracic spine: Secondary | ICD-10-CM

## 2024-08-05 DIAGNOSIS — J189 Pneumonia, unspecified organism: Secondary | ICD-10-CM

## 2024-08-05 DIAGNOSIS — F4310 Post-traumatic stress disorder, unspecified: Secondary | ICD-10-CM | POA: Diagnosis present

## 2024-08-05 DIAGNOSIS — I10 Essential (primary) hypertension: Secondary | ICD-10-CM | POA: Diagnosis present

## 2024-08-05 DIAGNOSIS — R1312 Dysphagia, oropharyngeal phase: Secondary | ICD-10-CM

## 2024-08-05 DIAGNOSIS — R1310 Dysphagia, unspecified: Secondary | ICD-10-CM | POA: Diagnosis not present

## 2024-08-05 DIAGNOSIS — R918 Other nonspecific abnormal finding of lung field: Secondary | ICD-10-CM | POA: Diagnosis not present

## 2024-08-05 DIAGNOSIS — C109 Malignant neoplasm of oropharynx, unspecified: Secondary | ICD-10-CM | POA: Diagnosis present

## 2024-08-05 DIAGNOSIS — R091 Pleurisy: Secondary | ICD-10-CM | POA: Diagnosis not present

## 2024-08-05 LAB — BASIC METABOLIC PANEL WITH GFR
Anion gap: 13 (ref 5–15)
Anion gap: 10 (ref 5–15)
BUN: 15 mg/dL (ref 6–20)
BUN: 14 mg/dL (ref 6–20)
CO2: 28 mmol/L (ref 22–32)
CO2: 28 mmol/L (ref 22–32)
Calcium: 9.9 mg/dL (ref 8.9–10.3)
Calcium: 9.2 mg/dL (ref 8.9–10.3)
Chloride: 99 mmol/L (ref 98–111)
Chloride: 102 mmol/L (ref 98–111)
Creatinine, Ser: 0.91 mg/dL (ref 0.61–1.24)
Creatinine, Ser: 0.89 mg/dL (ref 0.61–1.24)
GFR, Estimated: >60 mL/min
GFR, Estimated: >60 mL/min
Glucose, Bld: 109 mg/dL — ABNORMAL HIGH (ref 70–99)
Glucose, Bld: 109 mg/dL — ABNORMAL HIGH (ref 70–99)
Potassium: 3.9 mmol/L (ref 3.5–5.1)
Potassium: 4.3 mmol/L (ref 3.5–5.1)
Sodium: 140 mmol/L (ref 135–145)
Sodium: 140 mmol/L (ref 135–145)

## 2024-08-05 LAB — CBC
HCT: 44.2 % (ref 39.0–52.0)
Hemoglobin: 15 g/dL (ref 13.0–17.0)
MCH: 31.2 pg (ref 26.0–34.0)
MCHC: 33.9 g/dL (ref 30.0–36.0)
MCV: 91.9 fL (ref 80.0–100.0)
Platelets: 358 K/uL (ref 150–400)
RBC: 4.81 MIL/uL (ref 4.22–5.81)
RDW: 13 % (ref 11.5–15.5)
WBC: 16.1 K/uL — ABNORMAL HIGH (ref 4.0–10.5)
nRBC: 0 % (ref 0.0–0.2)

## 2024-08-05 LAB — TROPONIN T, HIGH SENSITIVITY
Troponin T High Sensitivity: <15 ng/L (ref 0–19)
Troponin T High Sensitivity: <15 ng/L (ref 0–19)

## 2024-08-05 LAB — RESP PANEL BY RT-PCR (RSV, FLU A&B, COVID)  RVPGX2
Influenza A by PCR: NEGATIVE
Influenza B by PCR: NEGATIVE
Resp Syncytial Virus by PCR: NEGATIVE
SARS Coronavirus 2 by RT PCR: NEGATIVE

## 2024-08-05 LAB — PRO BRAIN NATRIURETIC PEPTIDE: Pro Brain Natriuretic Peptide: <50 pg/mL

## 2024-08-05 MED ORDER — ONDANSETRON HCL 4 MG PO TABS: 4.0000 mg | ORAL_TABLET | Freq: Four times a day (QID) | ORAL | Status: DC | PRN

## 2024-08-05 MED ORDER — ACETAMINOPHEN 650 MG RE SUPP: 650.0000 mg | Freq: Four times a day (QID) | RECTAL | Status: DC | PRN

## 2024-08-05 MED ORDER — SODIUM CHLORIDE 0.9 % IV SOLN
500.0000 mg | Freq: Once | INTRAVENOUS | Status: AC
  Administered 2024-08-06: 500 mg via INTRAVENOUS
  Filled 2024-08-05 (×2): qty 5

## 2024-08-05 MED ORDER — HYDROMORPHONE HCL 1 MG/ML IJ SOLN
0.5000 mg | Freq: Four times a day (QID) | INTRAMUSCULAR | Status: DC | PRN
  Administered 2024-08-05: 0.5 mg via INTRAVENOUS
  Filled 2024-08-05: qty 0.5

## 2024-08-05 MED ORDER — ACETAMINOPHEN 325 MG PO TABS: 650.0000 mg | ORAL_TABLET | Freq: Four times a day (QID) | ORAL | Status: DC | PRN

## 2024-08-05 MED ORDER — ENOXAPARIN SODIUM 40 MG/0.4ML IJ SOSY
40.0000 mg | PREFILLED_SYRINGE | INTRAMUSCULAR | Status: DC
  Administered 2024-08-05 – 2024-08-07 (×3): 40 mg via SUBCUTANEOUS
  Filled 2024-08-05 (×3): qty 0.4

## 2024-08-05 MED ORDER — ONDANSETRON HCL 4 MG/2ML IJ SOLN: 4.0000 mg | Freq: Four times a day (QID) | INTRAMUSCULAR | Status: DC | PRN

## 2024-08-05 MED ORDER — SODIUM CHLORIDE 0.9 % IV BOLUS
1000.0000 mL | Freq: Once | INTRAVENOUS | Status: AC
  Administered 2024-08-05: 1000 mL via INTRAVENOUS

## 2024-08-05 MED ORDER — MORPHINE SULFATE (PF) 4 MG/ML IV SOLN
4.0000 mg | Freq: Once | INTRAVENOUS | Status: AC
  Administered 2024-08-05: 4 mg via INTRAVENOUS
  Filled 2024-08-05 (×2): qty 1

## 2024-08-05 MED ORDER — MORPHINE SULFATE (PF) 4 MG/ML IV SOLN
4.0000 mg | Freq: Once | INTRAVENOUS | Status: AC
  Administered 2024-08-05: 4 mg via INTRAVENOUS
  Filled 2024-08-05: qty 1

## 2024-08-05 MED ORDER — BISACODYL 5 MG PO TBEC: 5.0000 mg | DELAYED_RELEASE_TABLET | Freq: Every day | ORAL | Status: DC | PRN

## 2024-08-05 MED ORDER — SODIUM CHLORIDE 0.9 % IV SOLN
1.0000 g | Freq: Once | INTRAVENOUS | Status: AC
  Administered 2024-08-05: 1 g via INTRAVENOUS
  Filled 2024-08-05: qty 10

## 2024-08-05 MED ORDER — IOHEXOL 350 MG/ML SOLN
75.0000 mL | Freq: Once | INTRAVENOUS | Status: AC | PRN
  Administered 2024-08-05: 75 mL via INTRAVENOUS

## 2024-08-05 MED ORDER — SENNOSIDES-DOCUSATE SODIUM 8.6-50 MG PO TABS: 1.0000 | ORAL_TABLET | Freq: Every evening | ORAL | Status: DC | PRN

## 2024-08-05 MED ORDER — OXYCODONE-ACETAMINOPHEN 5-325 MG PO TABS
1.0000 | ORAL_TABLET | Freq: Four times a day (QID) | ORAL | Status: DC | PRN
  Administered 2024-08-06 – 2024-08-07 (×5): 1 via ORAL
  Filled 2024-08-05 (×5): qty 1

## 2024-08-05 NOTE — ED Triage Notes (Addendum)
 Cough and sob that started around 1200. Pt is breathing shallow and tachypneic in triage. Pt having difficulty speaking full sentences. Pt states he had a blood clot a month ago, is not currently on a blood thinner. States he has had blood in sputum and he is having stabbing upper back pain Pt has throat cancer, is not receiving radiation yet.

## 2024-08-05 NOTE — H&P (Addendum)
 " History and Physical  [name] FMW:[id number] DOB: May 20, 1976 DOA: 08/05/2024  PCP: [name], MD   Chief Complaint: Shortness of breath, cough  HPI: Stephen Nguyen is a 48 y.o. 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 Patient reports he has been advancing his diet at home and has been eating a soft diet for about a week. Today, he decided to try some spaghetti with meatballs. Not long after that, he started having coughing spells with associated back pain described as stabbing. He also endorsed shortness of breath with difficulty taking deep breaths. Reports he had 1 episode of hemoptysis prior to arrival.  He denies any fevers, chills, nausea, vomiting, abdominal pain or dysuria.  ED Course: Initial vitals show patient afebrile, RR 19-32, HR 100-1 tens, SBP 130-150s, SpO2 99% on room 2 LNC. Initial labs significant for WBC 16.1 otherwise normal renal function, troponin and proBNP, negative flu/RSV/COVID test. EKG shows sinus rhythm. CTA chest PE study negative for PE but shows RLL cavitary nodule measuring 1.3 x 1.1 cm, LLL pulmonary nodule measuring 2.2 x 1.2 cm with surrounding ground glass opacity, concerning for necrotic metastatic disease, necrotic primary lesion or infectious etiology. Pt received IV NS 1 L bolus, IV morphine , IV Rocephin  and IV azithromycin . TRH was consulted for admission.   Review of Systems: Please see HPI for pertinent positives and negatives. A complete 10 system review of systems are otherwise negative.  Past Medical History:  Diagnosis Date   GERD (gastroesophageal reflux disease)    Hypertension    Migraines    Past Surgical History:  Procedure Laterality Date   APPENDECTOMY     dental  implant     HERNIA REPAIR     childhood   ROTATOR CUFF REPAIR     Left   WISDOM TOOTH EXTRACTION     Social History:  reports that he has never smoked. He has never used smokeless tobacco. He reports that he does not drink alcohol and does not use drugs.  Allergies[1]  Family History  Problem Relation Age of Onset   Cancer Mother        GU   Asthma Father    Cancer Father        skin   Prostate cancer Paternal Grandfather    Cancer Paternal Grandmother      Prior to Admission medications  Medication Sig Start Date End Date Taking? Authorizing Provider  acetaminophen  (TYLENOL ) 325 MG tablet Take 2 tablets (650 mg total) by mouth every 6 (six) hours as needed for mild pain or fever (or Fever >/= 101). 11/26/18   Elgergawy, Brayton RAMAN, MD  albuterol  (VENTOLIN  HFA) 108 (90 Base) MCG/ACT inhaler Inhale 2 puffs into the lungs every 4 (four) hours as needed for wheezing or shortness of breath. 08/15/20   Mannam, Praveen, MD  COVID-19 mRNA bivalent vaccine, Pfizer, (PFIZER COVID-19 VAC BIVALENT) injection Inject into the muscle. Patient not taking: Reported on 07/25/2024 05/31/21   Luiz Channel, MD  famotidine (PEPCID) 20 MG tablet Take 20 mg by mouth 2 (two) times daily.    [provider]  Fluticasone -Salmeterol (ADVAIR DISKUS) 250-50 MCG/DOSE AEPB Inhale 1 puff into the lungs 2 (two) times daily. Patient not taking: Reported on 07/25/2024 08/15/20   Mannam, Praveen, MD  influenza vac split  quadrivalent PF (FLUARIX  QUADRIVALENT) 0.5 ML injection Inject into the muscle. Patient not taking: Reported on 07/25/2024 05/31/21     metoprolol  tartrate (LOPRESSOR ) 25 MG tablet Take 25 mg by mouth 2 (two) times daily. Patient not taking: Reported on 07/25/2024    [provider]  Multiple Vitamin (ONE-A-DAY MENS PO) Take 1 tablet by mouth daily. Patient not taking: Reported on 07/25/2024    [provider]  omeprazole  (KONVOMEP) 2 mg/mL SUSP oral suspension Take 20 mLs (40  mg total) by mouth daily. May otherwise take per tube. 07/25/24   Izell Domino, MD  SUMAtriptan (IMITREX) 50 MG tablet Take 50 mg by mouth every 2 (two) hours as needed for migraine. May repeat in 2 hours if headache persists or recurs. Patient not taking: Reported on 07/25/2024    [provider]  verapamil  (CALAN -SR) 120 MG CR tablet Take 120 mg by mouth at bedtime. Patient not taking: Reported on 07/25/2024    [provider]  vitamin C  (ASCORBIC ACID) 500 MG tablet Take 1 tablet (500 mg total) by mouth 2 (two) times daily. Patient not taking: Reported on 07/25/2024 11/26/18   Elgergawy, Brayton RAMAN, MD    Physical Exam: BP (!) 145/83   Pulse (!) 106   Temp (!) 97.5 F (36.4 C) (Oral)   Resp 20   Ht 5' 9.5 (1.765 m)   Wt 90.3 kg   SpO2 100%   BMI 28.97 kg/m  General: Pleasant, well-appearing middle-age man laying in bed. No acute distress. HEENT: [place]/AT. Anicteric sclera CV: Tachycardic. Regular rhythm. No murmurs, rubs, or gallops. No LE edema Pulmonary: On 2 L . Lungs CTAB. Normal effort. No wheezing or rales.  Restrictive lung fields with poor air movement.  Abdominal: Soft, nontender, nondistended. GJ tube stable in place with no surrounding erythema. Normal bowel sounds. Extremities: Palpable radial and DP pulses. Normal ROM. Skin: Warm and dry. No obvious rash or lesions. Neuro: A&Ox3. Moves all extremities. Normal sensation to light touch. No focal deficit. Psych: Normal mood and affect          Labs on Admission:  Basic Metabolic Panel: Recent Labs  Lab 08/05/24 1701 08/05/24 1837  NA 140 140  K 3.9 4.3  CL 99 102  CO2 28 28  GLUCOSE 109* 109*  BUN 15 14  CREATININE 0.91 0.89  CALCIUM 9.9 9.2   Liver Function Tests: No results for input(s): AST, ALT, ALKPHOS, BILITOT, PROT, ALBUMIN in the last 168 hours. No results for input(s): LIPASE, AMYLASE in the last 168 hours. No results for input(s): AMMONIA in the last 168  hours. CBC: Recent Labs  Lab 08/05/24 1701  WBC 16.1*  HGB 15.0  HCT 44.2  MCV 91.9  PLT 358   Cardiac Enzymes: No results for input(s): CKTOTAL, CKMB, CKMBINDEX, TROPONINI in the last 168 hours. BNP (last 3 results) No results for input(s): BNP in the last 8760 hours.  ProBNP (last 3 results) Recent Labs    08/05/24 1701  PROBNP <50.0    CBG: No results for input(s): GLUCAP in the last 168 hours.  Radiological Exams on Admission: CT Angio Chest PE W and/or Wo Contrast Result Date: 08/05/2024 EXAM: CTA CHEST 08/05/2024 05:45:16 PM TECHNIQUE: CTA of the chest was performed without and with the administration of 75 mL of iohexol  (OMNIPAQUE ) 350 MG/ML injection. Multiplanar reformatted images are provided for review. MIP images are provided for review. Automated exposure control, iterative reconstruction, and/or weight based adjustment of the mA/kV was utilized  to reduce the radiation dose to as low as reasonably achievable. COMPARISON: None available. CLINICAL HISTORY: Pulmonary embolism (PE) suspected, high prob. FINDINGS: PULMONARY ARTERIES: Pulmonary arteries are adequately opacified for evaluation. No acute pulmonary embolus. Main pulmonary artery is normal in caliber. MEDIASTINUM: The heart and pericardium demonstrate no acute abnormality. There is no acute abnormality of the thoracic aorta. LYMPH NODES: No mediastinal, hilar or axillary lymphadenopathy. LUNGS AND PLEURA: Cavitary nodule in the right lower lobe measures 1.3 x 1.1 cm. Left lower lobe pulmonary nodule measures 2.2 x 1.2 cm with surrounding ground glass opacity. No focal consolidation or pulmonary edema. No evidence of pleural effusion or pneumothorax. UPPER ABDOMEN: Percutaneous gastrojejunostomy tube is partially imaged. SOFT TISSUES AND BONES: No acute bone or soft tissue abnormality. IMPRESSION: 1. No evidence of pulmonary embolism. 2. Cavitary nodule in the right lower lobe measuring 1.3 x 1.1 cm. 3.  Left lower lobe pulmonary nodule measuring 2.2 x 1.2 cm with surrounding ground glass opacity. 4. Pulmonary nodules are indeterminate and may represent necrosis metastatic disease, necrotic primary lesion, or infectious etiology. Electronically signed by: Greig Pique MD 08/05/2024 07:12 PM EST RP Workstation: [id number]   DG Chest Port 1 View Result Date: 08/05/2024 EXAM: 1 VIEW XRAY OF THE CHEST 08/05/2024 05:15:00 PM COMPARISON: 07/01/2024 CLINICAL HISTORY: SOB (shortness of breath) FINDINGS: LUNGS AND PLEURA: Low lung volumes. No focal pulmonary opacity. No pleural effusion. No pneumothorax. HEART AND MEDIASTINUM: No acute abnormality of the cardiac and mediastinal silhouettes. Interval removal of the weighted feeding tube. BONES AND SOFT TISSUES: No acute osseous abnormality. IMPRESSION: 1. Markedly low lung volumes. No lobar pneumonia or pleural effusion. A follow-up chest CT had already been ordered at the time of this dictation. Please see that report for additional findings. Electronically signed by: Rogelia Myers MD 08/05/2024 06:01 PM EST RP Workstation: [id number]   Assessment/Plan Stephen Nguyen is a 48 y.o. 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, pending radiation and chemotherapy, oropharyngeal dysphagia s/p GJ tube, GERD, HTN, PTSD, asthma and migraine who presented to the ED for evaluation of shortness of breath and admitted for acute hypoxic respiratory failure, community-acquired pneumonia and sepsis  # Acute respiratory failure with hypoxia - Presented with acute onset of cough and shortness of breath and found to be hypoxic with new O2 requirements of 2 L [place] - This is likely secondary to  pneumonia - Continue supplemental O2, wean as able - Incentive spirometer, flutter  # Community-acquired pneumonia - CTA chest PE study shows RLL cavitary nodule measuring 1.3 x 1.1 cm, LLL pulmonary nodule  measuring 2.2 x 1.2 cm with surrounding ground glass opacity, concerning for necrotic metastatic disease, necrotic primary lesion or infectious etiology  - Continue IV Rocephin  and azithromycin  - Start IV NS at 125 cc/h for 1 day - Will consult pulmonology for further evaluation  # Back pain - Reports intermittent sharp back pain that is worse with coughing -This is likely secondary to his cavitary pulmonary nodules - Pain control with as needed Percocet and IV Dilaudid   # SCC of the oropharynx - Followed by ENT at [facility], presented with left upper neck mass seen for 2025 found to have 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 - Per chart review, patient underwent resection of the left tonsil/tongue base cancer with bilateral neck dissection on 06/23/2024 - Currently pending radiation and chemotherapy - Follow-up with ENT, radiation oncology and medical oncology in the  outpatient  # Oropharyngeal dysphagia s/p GJ tube - Status postplacement of GJ tube during oropharyngeal surgery - Reports he has been slightly up expansiveness diet and currently on soft diet - Registered dietitian consulted to assist with nutrition and tube feed - SLP consulted for swallow evaluation  # HTN - BP elevated with SBP in the 130s to 150s   DVT prophylaxis: Lovenox      Code Status: Full Code  Consults called: Pulmonology  Family Communication: Discussed findings/results and plan for admission with spouse at bedside  Severity of Illness: The appropriate patient status for this patient is INPATIENT. Inpatient status is judged to be reasonable and necessary in order to provide the required intensity of service to ensure the patient's safety. The patient's presenting symptoms, physical exam findings, and initial radiographic and laboratory data in the context of their chronic comorbidities is felt to place them at high risk for further clinical deterioration. Furthermore, it is not  anticipated that the patient will be medically stable for discharge from the hospital within 2 midnights of admission.   * I certify that at the point of admission it is my clinical judgment that the patient will require inpatient hospital care spanning beyond 2 midnights from the point of admission due to high intensity of service, high risk for further deterioration and high frequency of surveillance required.*  Level of care: Progressive    Lou Claretta HERO, MD 08/05/2024, 9:36 PM [facility] Pager: [phone] Isaiah 41:10   If 7PM-7AM, please contact night-coverage [email or web address] Password [email or web address]     [1] No Known Allergies  "

## 2024-08-05 NOTE — ED Notes (Signed)
 Lavender, Lt. Green and blue top sent to lab

## 2024-08-05 NOTE — ED Provider Notes (Signed)
 " [place] EMERGENCY DEPARTMENT AT [facility] Provider Note   CSN: [id number] Arrival date & time: 08/05/24  1640     Patient presents with: Shortness of Breath   Stephen Nguyen is a 48 y.o. male.   48 year old male history of head and neck cancer, not currently receiving treatment here today for shortness of breath, left-sided chest pain and hemoptysis.  Symptoms started at around noon, hemoptysis started 30 minutes ago.  Does have history of prior blood clot, not currently on any anticoagulation.  He denies fever or chills.   Shortness of Breath      Prior to Admission medications  Medication Sig Start Date End Date Taking? Authorizing Provider  acetaminophen  (TYLENOL ) 325 MG tablet Take 2 tablets (650 mg total) by mouth every 6 (six) hours as needed for mild pain or fever (or Fever >/= 101). 11/26/18   Elgergawy, Brayton RAMAN, MD  albuterol  (VENTOLIN  HFA) 108 (90 Base) MCG/ACT inhaler Inhale 2 puffs into the lungs every 4 (four) hours as needed for wheezing or shortness of breath. 08/15/20   Mannam, Praveen, MD  COVID-19 mRNA bivalent vaccine, Pfizer, (PFIZER COVID-19 VAC BIVALENT) injection Inject into the muscle. Patient not taking: Reported on 07/25/2024 05/31/21   Luiz Channel, MD  famotidine (PEPCID) 20 MG tablet Take 20 mg by mouth 2 (two) times daily.    [provider]  Fluticasone -Salmeterol (ADVAIR DISKUS) 250-50 MCG/DOSE AEPB Inhale 1 puff into the lungs 2 (two) times daily. Patient not taking: Reported on 07/25/2024 08/15/20   Mannam, Praveen, MD  influenza vac split quadrivalent PF (FLUARIX  QUADRIVALENT) 0.5 ML injection Inject into the muscle. Patient not taking: Reported on 07/25/2024 05/31/21     metoprolol  tartrate (LOPRESSOR ) 25 MG tablet Take 25 mg by mouth 2 (two) times daily. Patient not taking: Reported on 07/25/2024    [provider]  Multiple Vitamin (ONE-A-DAY MENS PO) Take 1 tablet by mouth daily. Patient not taking:  Reported on 07/25/2024    [provider]  omeprazole  (KONVOMEP) 2 mg/mL SUSP oral suspension Take 20 mLs (40 mg total) by mouth daily. May otherwise take per tube. 07/25/24   Izell Domino, MD  SUMAtriptan (IMITREX) 50 MG tablet Take 50 mg by mouth every 2 (two) hours as needed for migraine. May repeat in 2 hours if headache persists or recurs. Patient not taking: Reported on 07/25/2024    [provider]  verapamil  (CALAN -SR) 120 MG CR tablet Take 120 mg by mouth at bedtime. Patient not taking: Reported on 07/25/2024    [provider]  vitamin C  (ASCORBIC ACID) 500 MG tablet Take 1 tablet (500 mg total) by mouth 2 (two) times daily. Patient not taking: Reported on 07/25/2024 11/26/18   Elgergawy, Brayton RAMAN, MD    Allergies: Patient has no known allergies.    Review of Systems  Respiratory:  Positive for shortness of breath.     Updated Vital Signs BP (!) 153/97 (BP Location: Right Arm)   Pulse (!) 101   Temp 98.5 F (36.9 C) (Oral)   Resp (!) 25   Ht 5' 9.5 (1.765 m)   Wt 90.3 kg   SpO2 100%   BMI 28.97 kg/m   Physical Exam Vitals and nursing note reviewed.     (all labs ordered are listed, but only abnormal results are displayed) Labs Reviewed  RESP PANEL BY RT-PCR (RSV, FLU A&B, COVID)  [id number]  BASIC METABOLIC PANEL WITH GFR  CBC  PRO BRAIN NATRIURETIC PEPTIDE  TROPONIN T, HIGH SENSITIVITY    EKG: None  Radiology: No results found.   Procedures   Medications Ordered in the ED - No data to display                                  Medical Decision Making Amount and/or Complexity of Data Reviewed Labs: ordered. Radiology: ordered.        Final diagnoses:  None    ED Discharge Orders     None          Mannie Fairy DASEN, DO 08/07/24 1506  "

## 2024-08-06 ENCOUNTER — Encounter (HOSPITAL_COMMUNITY): Payer: Self-pay | Admitting: Student

## 2024-08-06 DIAGNOSIS — R1312 Dysphagia, oropharyngeal phase: Secondary | ICD-10-CM

## 2024-08-06 DIAGNOSIS — C109 Malignant neoplasm of oropharynx, unspecified: Secondary | ICD-10-CM | POA: Diagnosis not present

## 2024-08-06 DIAGNOSIS — J189 Pneumonia, unspecified organism: Secondary | ICD-10-CM

## 2024-08-06 DIAGNOSIS — M546 Pain in thoracic spine: Secondary | ICD-10-CM

## 2024-08-06 DIAGNOSIS — R918 Other nonspecific abnormal finding of lung field: Secondary | ICD-10-CM | POA: Diagnosis not present

## 2024-08-06 DIAGNOSIS — R091 Pleurisy: Secondary | ICD-10-CM | POA: Diagnosis not present

## 2024-08-06 DIAGNOSIS — R1310 Dysphagia, unspecified: Secondary | ICD-10-CM | POA: Diagnosis not present

## 2024-08-06 LAB — GLUCOSE, CAPILLARY
Glucose-Capillary: 105 mg/dL — ABNORMAL HIGH (ref 70–99)
Glucose-Capillary: 127 mg/dL — ABNORMAL HIGH (ref 70–99)

## 2024-08-06 LAB — RESPIRATORY PANEL BY PCR

## 2024-08-06 LAB — PROCALCITONIN: Procalcitonin: <0.1 ng/mL

## 2024-08-06 LAB — HIV ANTIBODY (ROUTINE TESTING W REFLEX): HIV Screen 4th Generation wRfx: NONREACTIVE

## 2024-08-06 LAB — BASIC METABOLIC PANEL WITH GFR
Anion gap: 13 (ref 5–15)
BUN: 13 mg/dL (ref 6–20)
CO2: 25 mmol/L (ref 22–32)
Calcium: 9.1 mg/dL (ref 8.9–10.3)
Chloride: 104 mmol/L (ref 98–111)
Creatinine, Ser: 0.91 mg/dL (ref 0.61–1.24)
GFR, Estimated: >60 mL/min
Glucose, Bld: 101 mg/dL — ABNORMAL HIGH (ref 70–99)
Potassium: 3.7 mmol/L (ref 3.5–5.1)
Sodium: 141 mmol/L (ref 135–145)

## 2024-08-06 LAB — CBC
HCT: 36.7 % — ABNORMAL LOW (ref 39.0–52.0)
Hemoglobin: 12.3 g/dL — ABNORMAL LOW (ref 13.0–17.0)
MCH: 31.2 pg (ref 26.0–34.0)
MCHC: 33.5 g/dL (ref 30.0–36.0)
MCV: 93.1 fL (ref 80.0–100.0)
Platelets: 221 K/uL (ref 150–400)
RBC: 3.94 MIL/uL — ABNORMAL LOW (ref 4.22–5.81)
RDW: 13 % (ref 11.5–15.5)
WBC: 12.6 K/uL — ABNORMAL HIGH (ref 4.0–10.5)
nRBC: 0 % (ref 0.0–0.2)

## 2024-08-06 LAB — EXPECTORATED SPUTUM ASSESSMENT W GRAM STAIN, RFLX TO RESP C

## 2024-08-06 LAB — MRSA NEXT GEN BY PCR, NASAL: MRSA by PCR Next Gen: NOT DETECTED

## 2024-08-06 LAB — STREP PNEUMONIAE URINARY ANTIGEN: Strep Pneumo Urinary Antigen: NEGATIVE

## 2024-08-06 MED ORDER — HYDROMORPHONE HCL 1 MG/ML IJ SOLN
1.0000 mg | Freq: Four times a day (QID) | INTRAMUSCULAR | Status: DC | PRN
  Administered 2024-08-06 – 2024-08-07 (×5): 1 mg via INTRAVENOUS
  Filled 2024-08-06 (×5): qty 1

## 2024-08-06 MED ORDER — SODIUM CHLORIDE 0.9 % IV SOLN
500.0000 mg | INTRAVENOUS | Status: DC
  Administered 2024-08-06 – 2024-08-07 (×2): 500 mg via INTRAVENOUS
  Filled 2024-08-06 (×3): qty 5

## 2024-08-06 MED ORDER — SODIUM CHLORIDE 0.9 % IV SOLN: 1.0000 g | INTRAVENOUS | Status: DC

## 2024-08-06 MED ORDER — KETOROLAC TROMETHAMINE 15 MG/ML IJ SOLN
15.0000 mg | Freq: Three times a day (TID) | INTRAMUSCULAR | Status: AC
  Administered 2024-08-06 – 2024-08-07 (×3): 15 mg via INTRAVENOUS
  Filled 2024-08-06 (×3): qty 1

## 2024-08-06 MED ORDER — GLUCAGON HCL RDNA (DIAGNOSTIC) 1 MG IJ SOLR: 1.0000 mg | INTRAMUSCULAR | Status: DC | PRN

## 2024-08-06 MED ORDER — SODIUM CHLORIDE 0.9 % IV SOLN
3.0000 g | Freq: Four times a day (QID) | INTRAVENOUS | Status: DC
  Administered 2024-08-06 – 2024-08-08 (×8): 3 g via INTRAVENOUS
  Filled 2024-08-06 (×6): qty 8

## 2024-08-06 MED ORDER — PROSOURCE TF20 ENFIT COMPATIBL EN LIQD
60.0000 mL | Freq: Two times a day (BID) | ENTERAL | Status: DC
  Administered 2024-08-06 – 2024-08-07 (×2): 60 mL
  Filled 2024-08-06 (×3): qty 60

## 2024-08-06 MED ORDER — METOPROLOL TARTRATE 5 MG/5ML IV SOLN: 5.0000 mg | INTRAVENOUS | Status: DC | PRN

## 2024-08-06 MED ORDER — GUAIFENESIN 100 MG/5ML PO LIQD
5.0000 mL | ORAL | Status: DC | PRN
  Administered 2024-08-07: 5 mL via ORAL
  Filled 2024-08-06: qty 10

## 2024-08-06 MED ORDER — OSMOLITE 1.5 CAL PO LIQD
1000.0000 mL | ORAL | Status: DC
  Administered 2024-08-06 – 2024-08-08 (×3): 1000 mL
  Filled 2024-08-06 (×3): qty 1000

## 2024-08-06 MED ORDER — FREE WATER
120.0000 mL | Status: DC
  Administered 2024-08-06 – 2024-08-08 (×10): 120 mL

## 2024-08-06 MED ORDER — HYDRALAZINE HCL 20 MG/ML IJ SOLN: 10.0000 mg | INTRAMUSCULAR | Status: DC | PRN

## 2024-08-06 MED ORDER — IPRATROPIUM-ALBUTEROL 0.5-2.5 (3) MG/3ML IN SOLN: 3.0000 mL | RESPIRATORY_TRACT | Status: DC | PRN

## 2024-08-06 MED ORDER — PANTOPRAZOLE SODIUM 40 MG PO TBEC
40.0000 mg | DELAYED_RELEASE_TABLET | Freq: Every day | ORAL | Status: DC
  Administered 2024-08-06 – 2024-08-08 (×3): 40 mg via ORAL
  Filled 2024-08-06 (×2): qty 1

## 2024-08-06 MED ORDER — IPRATROPIUM-ALBUTEROL 0.5-2.5 (3) MG/3ML IN SOLN
3.0000 mL | Freq: Two times a day (BID) | RESPIRATORY_TRACT | Status: DC
  Administered 2024-08-06 – 2024-08-07 (×4): 3 mL via RESPIRATORY_TRACT
  Filled 2024-08-06 (×4): qty 3

## 2024-08-06 MED ORDER — SODIUM CHLORIDE 0.9 % IV SOLN: INTRAVENOUS | Status: DC

## 2024-08-06 NOTE — Progress Notes (Signed)
 " PROGRESS NOTE    WINFORD HEHN  FMW:[id number] DOB: Aug 08, 1976 DOA: 08/05/2024 PCP: [name], MD    Brief Narrative:    48 y.o. 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 Patient reports he has been advancing his diet at home and has been eating a soft diet for about a week.  On the day of admission started having coughing spells.  Upon admission CTA was negative for PE but showed right lower lobe cavitary lesion and some concerns of aspiration along with necrotic metastatic mass.  Assessment & Plan:  Acute respiratory failure with hypoxia Pneumonia, concerns of aspiration -Continue supplemental oxygen, as needed as needed bronchodilators.  Continue azithromycin , change Rocephin  to Unasyn .  CTA negative for PE but shows multiple nodules and ground glass opacity concerning for necrotic metastatic disease and lung lesion.  Admitting provider has consulted pulmonary for their input as well   Back pain -Could be related to underlying pulmonary pathology but certainly cannot rule out any spinal mets.  Depending on his symptom progression, we can consider inpatient further spinal imaging.   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  Currently pending radiation and chemotherapy   Oropharyngeal dysphagia s/p GJ tube -Status post GJ tube placement during oropharyngeal surgery.  He has slowly been advancing his diet at home unfortunately with might have led to this aspiration event.   Speech and swallow team has been consulted   HTN -Improved closely monitor.  IV as needed   DVT prophylaxis: Lovenox     Code Status: Full Code Family Communication:   Continue hospital stay for at least 24-48 hours Pending speech and swallow and pulmonary evaluation  PT Follow up Recs:   Subjective: Cough and congestion has improved.  Able to ambulate and breathe better. Pain in his back is mostly pleuritic   Examination:  General exam: Appears calm and comfortable  Respiratory system: Clear to auscultation. Respiratory effort normal. Cardiovascular system: S1 & S2 heard, RRR. No JVD, murmurs, rubs, gallops or clicks. No pedal edema. Gastrointestinal system: Abdomen is nondistended, soft and nontender. No organomegaly or masses felt. Normal bowel sounds heard. Central nervous system: Alert and oriented. No focal neurological deficits. Extremities: Symmetric 5 x 5 power. Skin: No rashes, lesions or ulcers Psychiatry: Judgement and insight appear normal. Mood & affect appropriate.                Diet Orders (From admission, onward)     Start     Ordered   08/05/24 2124  Diet regular Room service appropriate? Yes; Fluid consistency: Thin  Diet effective now       Question Answer Comment  Room service appropriate? Yes   Fluid consistency: Thin      08/05/24 2123            Objective: Vitals:   08/05/24 2216 08/06/24 0132 08/06/24 0522 08/06/24 0919  BP: (!) 147/101 122/82 126/87 126/86  Pulse: 98 84 77 93  Resp: 16 16 16    Temp: 98.7 F (37.1 C) 99.2 F (37.3 C) 98.6  F (37 C) 97.8 F (36.6 C)  TempSrc: Oral Oral Oral Oral  SpO2: 100% 100% 100% 100%  Weight:      Height:        Intake/Output Summary (Last 24 hours) at 08/06/2024 1058 Last data filed at 08/06/2024 9082 Gross per 24 hour  Intake 2307.23 ml  Output --  Net 2307.23 ml   Filed Weights   08/05/24 1649  Weight: 90.3 kg    Scheduled Meds:  enoxaparin  (LOVENOX ) injection  40  mg Subcutaneous Q24H   ipratropium-albuterol   3 mL Nebulization BID   Continuous Infusions:  sodium chloride  100 mL/hr at 08/06/24 9082   ampicillin -sulbactam (UNASYN ) IV 3 g (08/06/24 0917)   azithromycin       Nutritional status     Body mass index is 28.97 kg/m.  Data Reviewed:   CBC: Recent Labs  Lab 08/05/24 1701 08/06/24 0335  WBC 16.1* 12.6*  HGB 15.0 12.3*  HCT 44.2 36.7*  MCV 91.9 93.1  PLT 358 221   Basic Metabolic Panel: Recent Labs  Lab 08/05/24 1701 08/05/24 1837 08/06/24 0335  NA 140 140 141  K 3.9 4.3 3.7  CL 99 102 104  CO2 28 28 25   GLUCOSE 109* 109* 101*  BUN 15 14 13   CREATININE 0.91 0.89 0.91  CALCIUM 9.9 9.2 9.1   GFR: Estimated Creatinine Clearance: 108.9 mL/min (by C-G formula based on SCr of 0.91 mg/dL). Liver Function Tests: No results for input(s): AST, ALT, ALKPHOS, BILITOT, PROT, ALBUMIN in the last 168 hours. No results for input(s): LIPASE, AMYLASE in the last 168 hours. No results for input(s): AMMONIA in the last 168 hours. Coagulation Profile: No results for input(s): INR, PROTIME in the last 168 hours. Cardiac Enzymes: No results for input(s): CKTOTAL, CKMB, CKMBINDEX, TROPONINI in the last 168 hours. BNP (last 3 results) Recent Labs    08/05/24 1701  PROBNP <50.0   HbA1C: No results for input(s): HGBA1C in the last 72 hours. CBG: No results for input(s): GLUCAP in the last 168 hours. Lipid Profile: No results for input(s): CHOL, HDL, LDLCALC, TRIG, CHOLHDL, LDLDIRECT in the last 72 hours. Thyroid Function Tests: No results for input(s): TSH, T4TOTAL, FREET4, T3FREE, THYROIDAB in the last 72 hours. Anemia Panel: No results for input(s): VITAMINB12, FOLATE, FERRITIN, TIBC, IRON, RETICCTPCT in the last 72 hours. Sepsis Labs: Recent Labs  Lab 08/06/24 0335  PROCALCITON <0.10    Recent Results (from the past 240 hours)  Resp panel by RT-PCR  (RSV, Flu A&B, Covid) Anterior Nasal Swab     Status: None   Collection Time: 08/05/24  5:12 PM   Specimen: Anterior Nasal Swab  Result Value Ref Range Status   SARS Coronavirus 2 by RT PCR NEGATIVE NEGATIVE Final    Comment: (NOTE) SARS-CoV-2 target nucleic acids are NOT DETECTED.  The SARS-CoV-2 RNA is generally detectable in upper respiratory specimens during the acute phase of infection. The lowest concentration of SARS-CoV-2 viral copies this assay can detect is 138 copies/mL. A negative result does not preclude SARS-Cov-2 infection and should not be used as the sole basis for treatment or other patient management decisions. A negative result may occur with  improper specimen collection/handling, submission of specimen other than nasopharyngeal swab, presence of viral mutation(s) within the areas targeted by this assay, and inadequate number of viral copies(<138 copies/mL). A negative result must be combined with clinical observations, patient history, and epidemiological information. The expected result is Negative.  Fact Sheet for Patients:  bloggercourse.com  Fact Sheet for Healthcare Providers:  seriousbroker.it  This test is no t yet approved or cleared by the [place]  FDA and  has been authorized for detection and/or diagnosis of SARS-CoV-2 by FDA under an Emergency Use Authorization (EUA). This EUA will remain  in effect (meaning this test can be used) for the duration of the COVID-19 declaration under Section 564(b)(1) of the Act, 21 U.S.C.section 360bbb-3(b)(1), unless the authorization is terminated  or revoked sooner.       Influenza A by PCR NEGATIVE NEGATIVE Final   Influenza B by PCR NEGATIVE NEGATIVE Final    Comment: (NOTE) The Xpert Xpress SARS-CoV-2/FLU/RSV plus assay is intended as an aid in the diagnosis of influenza from Nasopharyngeal swab specimens and should not be used as a sole basis for  treatment. Nasal washings and aspirates are unacceptable for Xpert Xpress SARS-CoV-2/FLU/RSV testing.  Fact Sheet for Patients: bloggercourse.com  Fact Sheet for Healthcare Providers: seriousbroker.it  This test is not yet approved or cleared by the [place]  FDA and has been authorized for detection and/or diagnosis of SARS-CoV-2 by FDA under an Emergency Use Authorization (EUA). This EUA will remain in effect (meaning this test can be used) for the duration of the COVID-19 declaration under Section 564(b)(1) of the Act, 21 U.S.C. section 360bbb-3(b)(1), unless the authorization is terminated or revoked.     Resp Syncytial Virus by PCR NEGATIVE NEGATIVE Final    Comment: (NOTE) Fact Sheet for Patients: bloggercourse.com  Fact Sheet for Healthcare Providers: seriousbroker.it  This test is not yet approved or cleared by the [place]  FDA and has been authorized for detection and/or diagnosis of SARS-CoV-2 by FDA under an Emergency Use Authorization (EUA). This EUA will remain in effect (meaning this test can be used) for the duration of the COVID-19 declaration under Section 564(b)(1) of the Act, 21 U.S.C. section 360bbb-3(b)(1), unless the authorization is terminated or revoked.  Performed at [facility], [address], [place], [place] [address]   MRSA Next Gen by PCR, Nasal     Status: None   Collection Time: 08/06/24 12:15 AM   Specimen: Nasal Mucosa; Nasal Swab  Result Value Ref Range Status   MRSA by PCR Next Gen NOT DETECTED NOT DETECTED Final    Comment: (NOTE) The GeneXpert MRSA Assay (FDA approved for NASAL specimens only), is one component of a comprehensive MRSA colonization surveillance program. It is not intended to diagnose MRSA infection nor to guide or monitor treatment for MRSA infections. Test performance is not FDA approved  in patients less than 44 years old. Performed at [facility], [address], [place], [place] [address]   Respiratory (~20 pathogens) panel by PCR     Status: None   Collection Time: 08/06/24 12:15 AM   Specimen: Nasopharyngeal Swab; Respiratory  Result Value Ref Range Status   Adenovirus NOT DETECTED NOT DETECTED Final   Coronavirus 229E NOT DETECTED NOT DETECTED Final    Comment: (NOTE) The Coronavirus on the Respiratory Panel, DOES NOT test for the novel  Coronavirus (2019 nCoV)    Coronavirus HKU1 NOT DETECTED NOT DETECTED Final   Coronavirus NL63 NOT DETECTED NOT DETECTED Final   Coronavirus OC43 NOT DETECTED NOT DETECTED Final   Metapneumovirus NOT DETECTED NOT DETECTED Final   Rhinovirus / Enterovirus NOT DETECTED NOT DETECTED Final   Influenza A NOT DETECTED NOT DETECTED Final   Influenza B NOT DETECTED NOT DETECTED Final   Parainfluenza Virus 1 NOT DETECTED NOT DETECTED  Final   Parainfluenza Virus 2 NOT DETECTED NOT DETECTED Final   Parainfluenza Virus 3 NOT DETECTED NOT DETECTED Final   Parainfluenza Virus 4 NOT DETECTED NOT DETECTED Final   Respiratory Syncytial Virus NOT DETECTED NOT DETECTED Final   Bordetella pertussis NOT DETECTED NOT DETECTED Final   Bordetella Parapertussis NOT DETECTED NOT DETECTED Final   Chlamydophila pneumoniae NOT DETECTED NOT DETECTED Final   Mycoplasma pneumoniae NOT DETECTED NOT DETECTED Final    Comment: Performed at [facility], [address], [place], [place] [address]  Expectorated Sputum Assessment w Gram Stain, Rflx to Resp Cult     Status: None   Collection Time: 08/06/24 12:25 AM   Specimen: Expectorated Sputum  Result Value Ref Range Status   Specimen Description EXPECTORATED SPUTUM  Final   Special Requests NONE  Final   Sputum evaluation   Final    Sputum specimen not acceptable for testing.  Please recollect.   DELMA GRADE RN  @ 08/06/24 0352 08/06/24 CAL Performed at [facility], [address] [place], [place] [address]    Report Status 08/06/2024 FINAL  Final         Radiology Studies: CT Angio Chest PE W and/or Wo Contrast Result Date: 08/05/2024 EXAM: CTA CHEST 08/05/2024 05:45:16 PM TECHNIQUE: CTA of the chest was performed without and with the administration of 75 mL of iohexol  (OMNIPAQUE ) 350 MG/ML injection. Multiplanar reformatted images are provided for review. MIP images are provided for review. Automated exposure control, iterative reconstruction, and/or weight based adjustment of the mA/kV was utilized to reduce the radiation dose to as low as reasonably achievable. COMPARISON: None available. CLINICAL HISTORY: Pulmonary embolism (PE) suspected, high prob. FINDINGS: PULMONARY ARTERIES: Pulmonary arteries are adequately opacified for evaluation. No acute pulmonary embolus. Main pulmonary artery is normal in caliber. MEDIASTINUM: The heart and pericardium demonstrate no acute abnormality. There is no acute abnormality of the thoracic aorta. LYMPH NODES: No mediastinal, hilar or axillary lymphadenopathy. LUNGS AND PLEURA: Cavitary nodule in the right lower lobe measures 1.3 x 1.1 cm. Left lower lobe pulmonary nodule measures 2.2 x 1.2 cm with surrounding ground glass opacity. No focal consolidation or pulmonary edema. No evidence of pleural effusion or pneumothorax. UPPER ABDOMEN: Percutaneous gastrojejunostomy tube is partially imaged. SOFT TISSUES AND BONES: No acute bone or soft tissue abnormality. IMPRESSION: 1. No evidence of pulmonary embolism. 2. Cavitary nodule in the right lower lobe measuring 1.3 x 1.1 cm. 3. Left lower lobe pulmonary nodule measuring 2.2 x 1.2 cm with surrounding ground glass opacity. 4. Pulmonary nodules are indeterminate and may represent necrosis metastatic disease, necrotic primary lesion, or infectious etiology. Electronically signed by: Greig Pique MD 08/05/2024 07:12 PM EST RP Workstation: [id number]   DG Chest Port 1  View Result Date: 08/05/2024 EXAM: 1 VIEW XRAY OF THE CHEST 08/05/2024 05:15:00 PM COMPARISON: 07/01/2024 CLINICAL HISTORY: SOB (shortness of breath) FINDINGS: LUNGS AND PLEURA: Low lung volumes. No focal pulmonary opacity. No pleural effusion. No pneumothorax. HEART AND MEDIASTINUM: No acute abnormality of the cardiac and mediastinal silhouettes. Interval removal of the weighted feeding tube. BONES AND SOFT TISSUES: No acute osseous abnormality. IMPRESSION: 1. Markedly low lung volumes. No lobar pneumonia or pleural effusion. A follow-up chest CT had already been ordered at the time of this dictation. Please see that report for additional findings. Electronically signed by: Rogelia Myers MD 08/05/2024 06:01 PM EST RP Workstation: [id number]           LOS: 1 day  Time spent= 35 mins    Burgess JAYSON Dare, MD [facility]  If 7PM-7AM, please contact night-coverage  08/06/2024, 10:58 AM  "

## 2024-08-06 NOTE — Progress Notes (Signed)
 Initial Nutrition Assessment  DOCUMENTATION CODES:   Not applicable  INTERVENTION:  Adjust diet order to DYS3 until SLP can evaluate and enter recommendations Initiate tube feeding via J-port of GJ tube: Osmolite 1.5 at 60 ml/h (1440 ml per day) Start at 30 and advance by 10mL every 4 hours to reach goal Prosource TF20 60 ml BID Free water : every 4 hours Provides 2320 kcal, 130 gm protein, 1097 ml free water  daily ( free water , TF+flush)   NUTRITION DIAGNOSIS:   Increased nutrient needs related to cancer and cancer related treatments as evidenced by estimated needs.  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   RD working remotely. Attempted to call pt on room phone, but no answer at this time. Reached out to RN, also no response.  Extensive chart review of pt's enteral nutrition hx. Appears that pt receives his enteral feeding supplies through the [facility]. However, the majority of prior care and RD assessments have been through [facility]. Was receiving Osmolite 1.5 at [facility] and Nutren 1.5 through the [facility]. Pt initially had DHT placed 11/15 during admission at [facility] and was discharged home with NGT. Later returned to [facility] to have NGT repositioned post-pyloric as he was not tolerating bolus feeds at home.   Underwent placement of GJ-tube during admission at [facility] earlier this month. Pt was to be fed via J port and meds via G port. Pt had reported to RD at [facility] he was receiving 1.8L of formula each day via pump (90mL x 20 hours). Pt expressed desire to be connected to pump as little time as possible. Would  highly recommend pt discuss retrying bolus feeds with RD as this would allow him more freedom from pump. May need to have slower titration of goal bolus feeds to promote tolerance  Also note that since placement of GJ tube SLP outpatient has allowed for a soft diet. Noted this was DYS3 and will adjust current diet order to match until SLP inpatient team can evaluate. Pt ate mixed consistency plate (spaghetti and meatballs) the day of admission which he noted seemed to trigger coughing and pain. Workup in ED concerning for aspiration PNA but imaging also worrisome for necrotic metastatic disease and lung lesion. Pulmonary consult pending as well as SLP consult.   Noted that pt has lost a significant amount of weight over the last 3 months. Based on enteral prescription that pt received in November, do not think adequate protein and kcal were being provided.   1 meal noted this AM at 100% consumed.  Will plan for enteral nutrition to meet low end of pt's kcal needs with continuous infusion. RD will follow-up in-person to adjust rate and enteral prescription as needed.   Admit / Current weight: 90.3 kg   9.1% weight loss noted in the last 3 months if accurate (9/29-12/26) which is severe for timeframe  Average Meal Intake: 12/27: 100% intake x 1 recorded meals  Nutritionally Relevant Medications: Continuous Infusions:  sodium chloride  100 mL/hr at 08/06/24 1150   ampicillin -sulbactam (UNASYN ) IV Stopped (08/06/24 0947)   azithromycin      PRN Meds: bisacodyl , glucagon , ondansetron ,  senna-docusate  Labs Reviewed  NUTRITION - FOCUSED PHYSICAL EXAM: Defer to in-person assessment  Diet Order:   Diet Order             Diet regular Room service appropriate? Yes; Fluid consistency: Thin  Diet effective now                   EDUCATION NEEDS:   Education needs have been addressed  Skin:  Skin Assessment: Reviewed RN Assessment  Last BM:  12/26  Height:  Ht Readings from Last 1  Encounters:  08/05/24 5' 9.5 (1.765 m)    Weight:  Wt Readings from Last 1 Encounters:  08/05/24 90.3 kg    Ideal Body Weight:  75.5 kg  BMI:  Body mass index is 28.97 kg/m.  Estimated Nutritional Needs:  Kcal:  2300-2600 kcal/d Protein:  115-130g/d Fluid:  >/=2.4L/d    Vernell Lukes, RD, LDN, CNSC Registered Dietitian II Please reach out via secure chat

## 2024-08-06 NOTE — Hospital Course (Addendum)
 Brief Narrative:    48 y.o. 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 Patient reports he has been advancing his diet at home and has been eating a soft diet for about a week.  On the day of admission started having coughing spells.  Upon admission CTA was negative for PE but showed right lower lobe cavitary lesion and some concerns of aspiration along with necrotic metastatic mass.  Assessment & Plan:  Acute respiratory failure with hypoxia Pneumonia, concerns of aspiration -Continue supplemental oxygen, as needed as needed bronchodilators.  Continue azithromycin , change Rocephin  to Unasyn .  CTA negative for PE but shows multiple nodules and ground glass opacity concerning for necrotic metastatic disease and lung lesion.  Admitting provider has consulted pulmonary for their input as well   Back pain -Could be related to underlying pulmonary pathology but certainly cannot rule out any spinal mets.  Depending on his symptom progression, we can consider inpatient further spinal imaging.   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  Currently pending radiation and chemotherapy   Oropharyngeal dysphagia s/p GJ tube -Status post GJ tube placement during oropharyngeal surgery.  He has slowly been advancing his diet at home unfortunately with might have led to this aspiration event.  Speech and swallow team has been consulted   HTN -Improved closely monitor.  IV as needed   DVT prophylaxis:  Lovenox     Code Status: Full Code Family Communication:   Continue hospital stay for at least 24-48 hours Pending speech and swallow and pulmonary evaluation  PT Follow up Recs:   Subjective: Cough and congestion has improved.  Able to ambulate and breathe better. Pain in his back is mostly pleuritic   Examination:  General exam: Appears calm and comfortable  Respiratory system: Clear to auscultation. Respiratory effort normal. Cardiovascular system: S1 & S2 heard, RRR. No JVD, murmurs, rubs, gallops or clicks. No pedal edema. Gastrointestinal system: Abdomen is nondistended, soft and nontender. No organomegaly or masses felt. Normal bowel sounds heard. Central nervous system: Alert and oriented. No focal neurological deficits. Extremities: Symmetric 5 x 5 power. Skin: No rashes, lesions or ulcers Psychiatry: Judgement and insight appear normal. Mood & affect appropriate.

## 2024-08-06 NOTE — Consult Note (Signed)
 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 CTA chest was negative for PE but showed right lower lobe cavitary lesion. Patient reports hemoptysis that started yesterday. He is also having left back pain with deep breaths and coughing.   Wife is at bedside. Patient is about to start radiation therapy. He does report trouble with his swallowing at times. Reviewed PET scan findings from October which did not show any lung lesions compared to the CT chest scan from yesterday. Images reviewed in person.   He denies fevers, chills or sweats. He denies any over risk factors for TB. 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   Pertinent  Medical History   Past Medical History:  Diagnosis Date   GERD (gastroesophageal reflux disease)    Hypertension    Migraines      Significant Hospital Events: Including procedures, antibiotic start and stop dates in addition to other pertinent events   12/26 admitted to hospital  Interim History / Subjective:  As above  Objective    Blood pressure 123/83, pulse 90, temperature 97.8 F (36.6 C), temperature source Oral, resp. rate 16, height 5' 9.5 (1.765 m), weight 90.3 kg, SpO2 98%.        Intake/Output Summary (Last 24 hours) at 08/06/2024 1603 Last data filed at 08/06/2024 1150 Gross per 24 hour  Intake 2606.39 ml  Output --  Net 2606.39 ml   Filed Weights   08/05/24 1649  Weight: 90.3 kg    Examination: General: middle aged male, no distress HENT: [place]/AT, moist mucous membranes Lungs: clear to auscultation, no wheezing Cardiovascular: rrr, no  murmurs Abdomen: soft, non-tender, non-distended Extremities: warm, no edema Neuro: alert, moving all extremities GU: n/a  PET/CT Skulls Base to Mid Thigh 05/20/24 CONCLUSION:  1. Hypermetabolic left base of tongue mass consistent with known primary malignancy.  2.  Hypermetabolic left level 2A lymph nodes consistent with metastases.  3.  No additional suspicious FDG avid foci.   CTA Chest 08/05/24 1. No evidence of pulmonary embolism. 2. Cavitary nodule in the right lower lobe measuring 1.3 x 1.1 cm. 3. Left lower lobe pulmonary nodule measuring 2.2 x 1.2 cm with surrounding ground glass opacity. 4. Pulmonary nodules are indeterminate and may represent necrosis metastatic disease, necrotic primary lesion, or infectious etiology.  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 He does not have concerning history for TB. No B symptoms. He did not have these lesions on recent PET scan from October. Most likely etiology of these lesions is aspiration leading to chronic infection/inflammation.  Plan: - treat with augmentin  for 1 month - obtain sputum culture if able - plan for repeat CT Chest scan in 1 month and follow up in clinic - hold off on bronchoscopy at this time given timeline of findings on PET scan and CT scan on admission. - add toradol  scheduled for 3 doses along with narcotic pain regimen for pleurisy pain control  PCCM will follow  Labs  CBC: Recent Labs  Lab 08/05/24 1701 08/06/24 0335  WBC 16.1* 12.6*  HGB 15.0 12.3*  HCT 44.2 36.7*  MCV 91.9 93.1  PLT 358 221    Basic Metabolic Panel: Recent Labs  Lab 08/05/24 1701 08/05/24 1837 08/06/24 0335  NA 140 140 141  K 3.9 4.3 3.7  CL 99 102 104  CO2 28 28 25   GLUCOSE 109* 109* 101*  BUN 15 14 13    CREATININE 0.91 0.89 0.91  CALCIUM 9.9 9.2 9.1   GFR: Estimated Creatinine Clearance: 108.9 mL/min (by C-G formula based on SCr of 0.91 mg/dL). Recent Labs  Lab 08/05/24 1701 08/06/24 0335  PROCALCITON  --  <0.10  WBC 16.1* 12.6*    Liver Function Tests: No results for input(s): AST, ALT, ALKPHOS, BILITOT, PROT, ALBUMIN in the last 168 hours. No results for input(s): LIPASE, AMYLASE in the last 168 hours. No results for input(s): AMMONIA in the last 168 hours.  ABG No results found for: PHART, PCO2ART, PO2ART, HCO3, TCO2, ACIDBASEDEF, O2SAT   Coagulation Profile: No results for input(s): INR, PROTIME in the last 168 hours.  Cardiac Enzymes: No results for input(s): CKTOTAL, CKMB, CKMBINDEX, TROPONINI in the last 168 hours.  HbA1C: No results found for: HGBA1C  CBG: No results for input(s): GLUCAP in the last 168 hours.  Review of Systems:   Review of Systems  Constitutional:  Negative for chills, fever, malaise/fatigue and weight loss.  HENT:  Negative for congestion, sinus pain and sore throat.   Eyes: Negative.   Respiratory:  Positive for cough and hemoptysis. Negative for sputum production, shortness of breath and wheezing.   Cardiovascular:  Negative for chest pain, palpitations, orthopnea, claudication and leg swelling.  Gastrointestinal:  Negative for abdominal pain, heartburn, nausea and vomiting.  Genitourinary: Negative.   Musculoskeletal:  Negative for joint pain and myalgias.  Skin:  Negative for rash.  Neurological:  Negative for weakness.  Endo/Heme/Allergies: Negative.   Psychiatric/Behavioral: Negative.       Past Medical History:  He,  has a past medical history of GERD (gastroesophageal reflux disease), Hypertension, and Migraines.   Surgical History:   Past Surgical History:  Procedure Laterality Date   APPENDECTOMY     dental implant     HERNIA REPAIR     childhood   ROTATOR CUFF REPAIR      Left   WISDOM TOOTH EXTRACTION       Social History:   reports that he has never smoked. He has never used smokeless tobacco. He reports that he does not drink alcohol and does not use drugs.   Family History:  His family history includes Asthma in his father; Cancer in his father, mother, and paternal grandmother; Prostate cancer in his paternal grandfather.   Allergies Allergies[1]   Home Medications  Prior to Admission medications  Medication Sig Start Date End Date Taking? Authorizing Provider  acetaminophen  (TYLENOL ) 325 MG tablet Take 2 tablets (650 mg total) by mouth every 6 (six) hours as needed for mild pain or fever (or Fever >/= 101). 11/26/18  Yes Elgergawy, Brayton RAMAN, MD  omeprazole  (PRILOSEC) 40 MG capsule Take 40 mg by mouth daily.   Yes [provider]     Critical care time: n/a    Dorn Chill, MD [facility] Office: [phone]   See Amion for personal pager PCCM on call pager [phone] until 7pm. Please call Elink 7p-7a. [phone]            [  1] No Known Allergies  "

## 2024-08-06 NOTE — Plan of Care (Signed)
" °  Problem: Nutrition: Goal: Adequate nutrition will be maintained Outcome: Progressing   P Problem: Elimination: Goal: Will not experience complications related to bowel motility Outcome: Progressing   Problem: Elimination: Goal: Will not experience complications related to urinary retention Outcome: Progressing   Problem: Pain Managment: Goal: General experience of comfort will improve and/or be controlled Outcome: Progressing   "

## 2024-08-07 ENCOUNTER — Telehealth: Payer: Self-pay | Admitting: Pulmonary Disease

## 2024-08-07 DIAGNOSIS — R918 Other nonspecific abnormal finding of lung field: Secondary | ICD-10-CM | POA: Diagnosis not present

## 2024-08-07 DIAGNOSIS — R1310 Dysphagia, unspecified: Secondary | ICD-10-CM | POA: Diagnosis not present

## 2024-08-07 DIAGNOSIS — J189 Pneumonia, unspecified organism: Secondary | ICD-10-CM

## 2024-08-07 DIAGNOSIS — C109 Malignant neoplasm of oropharynx, unspecified: Secondary | ICD-10-CM | POA: Diagnosis not present

## 2024-08-07 DIAGNOSIS — R091 Pleurisy: Secondary | ICD-10-CM | POA: Diagnosis not present

## 2024-08-07 LAB — BASIC METABOLIC PANEL WITH GFR
Anion gap: 10 (ref 5–15)
BUN: 11 mg/dL (ref 6–20)
CO2: 26 mmol/L (ref 22–32)
Calcium: 8.8 mg/dL — ABNORMAL LOW (ref 8.9–10.3)
Chloride: 104 mmol/L (ref 98–111)
Creatinine, Ser: 0.79 mg/dL (ref 0.61–1.24)
GFR, Estimated: >60 mL/min
Glucose, Bld: 120 mg/dL — ABNORMAL HIGH (ref 70–99)
Potassium: 3.6 mmol/L (ref 3.5–5.1)
Sodium: 139 mmol/L (ref 135–145)

## 2024-08-07 LAB — LEGIONELLA PNEUMOPHILA SEROGP 1 UR AG: L. pneumophila Serogp 1 Ur Ag: NEGATIVE

## 2024-08-07 LAB — PHOSPHORUS: Phosphorus: 4.3 mg/dL (ref 2.5–4.6)

## 2024-08-07 LAB — CBC
HCT: 36.5 % — ABNORMAL LOW (ref 39.0–52.0)
Hemoglobin: 12.5 g/dL — ABNORMAL LOW (ref 13.0–17.0)
MCH: 31.5 pg (ref 26.0–34.0)
MCHC: 34.2 g/dL (ref 30.0–36.0)
MCV: 91.9 fL (ref 80.0–100.0)
Platelets: 270 K/uL (ref 150–400)
RBC: 3.97 MIL/uL — ABNORMAL LOW (ref 4.22–5.81)
RDW: 12.9 % (ref 11.5–15.5)
WBC: 5.9 K/uL (ref 4.0–10.5)
nRBC: 0 % (ref 0.0–0.2)

## 2024-08-07 LAB — GLUCOSE, CAPILLARY
Glucose-Capillary: 128 mg/dL — ABNORMAL HIGH (ref 70–99)
Glucose-Capillary: 107 mg/dL — ABNORMAL HIGH (ref 70–99)
Glucose-Capillary: 108 mg/dL — ABNORMAL HIGH (ref 70–99)
Glucose-Capillary: 119 mg/dL — ABNORMAL HIGH (ref 70–99)
Glucose-Capillary: 133 mg/dL — ABNORMAL HIGH (ref 70–99)

## 2024-08-07 LAB — MAGNESIUM: Magnesium: 2.1 mg/dL (ref 1.7–2.4)

## 2024-08-07 MED ORDER — DIPHENHYDRAMINE HCL 25 MG PO CAPS
25.0000 mg | ORAL_CAPSULE | Freq: Once | ORAL | Status: AC | PRN
  Administered 2024-08-07: 25 mg via ORAL
  Filled 2024-08-07: qty 1

## 2024-08-07 MED ORDER — ORAL CARE MOUTH RINSE
15.0000 mL | OROMUCOSAL | Status: DC
  Administered 2024-08-07 – 2024-08-08 (×4): 15 mL via OROMUCOSAL

## 2024-08-07 MED ORDER — ORAL CARE MOUTH RINSE: 15.0000 mL | OROMUCOSAL | Status: DC | PRN

## 2024-08-07 NOTE — Evaluation (Signed)
 Clinical/Bedside Swallow Evaluation Patient Details  Name: Stephen Nguyen MRN: [id number] Date of Birth: 11-04-1975  Today's Date: 08/07/2024 Time: SLP Start Time (ACUTE ONLY): 0830 SLP Stop Time (ACUTE ONLY): 0853 SLP Time Calculation (min) (ACUTE ONLY): 23 min  Past Medical History:  Past Medical History:  Diagnosis Date   GERD (gastroesophageal reflux disease)    Hypertension    Migraines    Past Surgical History:  Past Surgical History:  Procedure Laterality Date   APPENDECTOMY     dental implant     HERNIA REPAIR     childhood   ROTATOR CUFF REPAIR     Left   WISDOM TOOTH EXTRACTION     HPI:  48yo male admitted 08/05/24 with cough, SOB. FEES 08/02/24: mild-mod pharyngeal dysphagia, significant residue, pen thin. Reg/thin. PMH: recently dx SCC oropharynx (left tonsil/tongue base) s/p bilateral neck dissection 06/23/24. Pending chemo/rad tx, oropharyngeal dysphagia, GERD, HTN, PTSD, asthma, migraine.    Assessment / Plan / Recommendation  Clinical Impression  Pt presents with functional oropharyngeal swallow at bedside without overt s/s aspiration observed. Pt verbalized and demonstrated safe swallow precautions given during recent instrumental study via FEES (completed 08/02/24). Improving but currently Mild-moderate pharyngeal dysphagia with penetration and aspiration that cleared effectively with cough.   Pt reports left facial numbness is improving. Lingual numbness has resolved. Pt reports he can taste salty but not sweet. CN exam WFL. Pt accepted trials of thin liquid, puree, and solid textures without overt s/s aspiration. SLP reiterated education received 08/02/24, including risks and adverse outcomes associated with dysphagia and aspiration, importance of daily thorough oral care, and rationale for adherence to safe swallow precaution and therapy recommendations. ST will follow acutely to assess diet tolerance and continue education. Safe swallow precautions  reviewed with pt and posted at [facility].  SLP Visit Diagnosis: Dysphagia, pharyngeal phase (R13.13)    Aspiration Risk  Moderate aspiration risk;Risk for inadequate nutrition/hydration    Diet Recommendation Regular;Thin liquid    Liquid Administration via: Cup;Straw Medication Administration: Whole meds with liquid Supervision: Patient able to self feed Compensations: Minimize environmental distractions;Slow rate;Small sips/bites;Clear throat intermittently;Multiple dry swallows after each bite/sip Postural Changes: Seated upright at 90 degrees;Remain upright for at least 30 minutes after po intake    Other Recommendations Oral Care Recommendations: Oral care BID     Swallow Evaluation Recommendations  Regular diet/thin liquids, meds as tolerated   Assistance Recommended at Discharge  OP speech therapy for pharyngeal dysphagia  Functional Status Assessment Patient has had a recent decline in their functional status and demonstrates the ability to make significant improvements in function in a reasonable and predictable amount of time.  Frequency and Duration min 1 x/week  2 weeks       Prognosis Prognosis for improved oropharyngeal function: Good Barriers to Reach Goals: Other (Comment) (pending radiation treatment for oropharyngeal cancer.)      Swallow Study   General Date of Onset: 08/05/24 HPI: 48yo male admitted 08/05/24 with cough, SOB. FEES 08/02/24: mild-mod pharyngeal dysphagia, significant residue, pen thin. Reg/thin. PMH: recently dx SCC oropharynx (left tonsil/tongue base) s/p bilateral neck dissection 06/23/24. Pending chemo/rad tx, oropharyngeal dysphagia, GERD, HTN, PTSD, asthma, migraine. Type of Study: Bedside Swallow Evaluation Previous Swallow Assessment: FEES 08/02/24: reg/thin Diet Prior to this Study: Dysphagia 3 (mechanical soft);Thin liquids (Level 0) Temperature Spikes Noted: No Respiratory Status: Room air History of Recent Intubation:  No Behavior/Cognition: Alert;Cooperative;Pleasant mood Oral Cavity Assessment: Within Functional Limits Oral Care Completed by SLP: No Oral Cavity -  Dentition: Adequate natural dentition Vision: Functional for self-feeding Self-Feeding Abilities: Able to feed self Patient Positioning: Other (comment) (seated at EOB) Baseline Vocal Quality: Normal Volitional Cough: Strong Volitional Swallow: Able to elicit    Oral/Motor/Sensory Function Overall Oral Motor/Sensory Function: Within functional limits (Pt reports numbness of left face is improving. Lingual numbness has resolved.)   Ice Chips Ice chips: Not tested   Thin Liquid Thin Liquid: Within functional limits Presentation: Cup;Straw    Nectar Thick Nectar Thick Liquid: Not tested   Honey Thick Honey Thick Liquid: Not tested   Puree Puree: Within functional limits Presentation: Self Fed;Spoon   Solid     Solid: Within functional limits Presentation: Self Fed     Valta Dillon B. Dory, MSP, CCC-SLP Speech Language Pathologist Office: [phone]  Dory Caprice Daring 08/07/2024,9:05 AM

## 2024-08-07 NOTE — Consult Note (Signed)
 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 CTA chest was negative for PE but showed right lower lobe cavitary lesion. Patient reports hemoptysis that started yesterday. He is also having left back pain with deep breaths and coughing.   Wife is at bedside. Patient is about to start radiation therapy. He does report trouble with his swallowing at times. Reviewed PET scan findings from October which did not show any lung lesions compared to the CT chest scan from yesterday. Images reviewed in person.   He denies fevers, chills or sweats. He denies any over risk factors for TB. 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   Pertinent  Medical History   Past Medical History:  Diagnosis Date   GERD (gastroesophageal reflux disease)    Hypertension    Migraines      Significant Hospital Events: Including procedures, antibiotic start and stop dates in addition to other pertinent events   12/26 admitted to hospital  Interim History / Subjective:   Patient feeling better, able to take deep breaths. Having less pain.  Objective    Blood pressure 119/88, pulse 75, temperature 98.2 F (36.8 C), temperature source Oral, resp. rate 16, height 5' 9.5 (1.765 m), weight 90.1 kg, SpO2 95%.        Intake/Output Summary (Last 24 hours) at 08/07/2024 1354 Last data filed at 08/07/2024 1222 Gross per 24 hour  Intake 2615.08 ml  Output 600 ml  Net 2015.08 ml   Filed Weights   08/05/24 1649 08/07/24 0500  Weight: 90.3 kg 90.1 kg    Examination: General: middle aged male, no distress HENT: [place]/AT, moist mucous  membranes Lungs: clear to auscultation, no wheezing Cardiovascular: rrr, no murmurs Abdomen: soft, non-tender, non-distended Extremities: warm, no edema Neuro: alert, moving all extremities GU: n/a  PET/CT Skulls Base to Mid Thigh 05/20/24 CONCLUSION:  1. Hypermetabolic left base of tongue mass consistent with known primary malignancy.  2.  Hypermetabolic left level 2A lymph nodes consistent with metastases.  3.  No additional suspicious FDG avid foci.   CTA Chest 08/05/24 1. No evidence of pulmonary embolism. 2. Cavitary nodule in the right lower lobe measuring 1.3 x 1.1 cm. 3. Left lower lobe pulmonary nodule measuring 2.2 x 1.2 cm with surrounding ground glass opacity. 4. Pulmonary nodules are indeterminate and may represent necrosis metastatic disease, necrotic primary lesion, or infectious etiology.  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 He does not have concerning history for TB. No B symptoms. He did not have these lesions on recent PET scan from October. Most likely etiology of these lesions is aspiration leading to chronic infection/inflammation.  Plan: - treat with augmentin  (liquid form) for 1 month - plan for repeat CT Chest scan in 1 month and follow up in clinic - hold off on bronchoscopy at this time given timeline of findings on PET scan and CT scan on admission. - toradol  PRN with narcotic pain regimen for pleurisy pain control -  discussed using ibuprofen  as needed for pleuritic pain once discharged - if his dysphagia progresses once radiation therapy starts, would recommend NPO status to protect the lungs and maintain nutrition via feeding tube until he has worked with speech therapy. Reviewed these precautions with patient and spouse today.  PCCM will  sign off. Will arrange follow up and CT Chest scan in clinic  Labs   CBC: Recent Labs  Lab 08/05/24 1701 08/06/24 0335 08/07/24 0917  WBC 16.1* 12.6* 5.9  HGB 15.0 12.3* 12.5*  HCT 44.2 36.7* 36.5*  MCV 91.9 93.1 91.9  PLT 358 221 270    Basic Metabolic Panel: Recent Labs  Lab 08/05/24 1701 08/05/24 1837 08/06/24 0335 08/07/24 0355  NA 140 140 141 139  K 3.9 4.3 3.7 3.6  CL 99 102 104 104  CO2 28 28 25 26   GLUCOSE 109* 109* 101* 120*  BUN 15 14 13 11   CREATININE 0.91 0.89 0.91 0.79  CALCIUM 9.9 9.2 9.1 8.8*  MG  --   --   --  2.1  PHOS  --   --   --  4.3   GFR: Estimated Creatinine Clearance: 123.8 mL/min (by C-G formula based on SCr of 0.79 mg/dL). Recent Labs  Lab 08/05/24 1701 08/06/24 0335 08/07/24 0917  PROCALCITON  --  <0.10  --   WBC 16.1* 12.6* 5.9    Liver Function Tests: No results for input(s): AST, ALT, ALKPHOS, BILITOT, PROT, ALBUMIN in the last 168 hours. No results for input(s): LIPASE, AMYLASE in the last 168 hours. No results for input(s): AMMONIA in the last 168 hours.  ABG No results found for: PHART, PCO2ART, PO2ART, HCO3, TCO2, ACIDBASEDEF, O2SAT   Coagulation Profile: No results for input(s): INR, PROTIME in the last 168 hours.  Cardiac Enzymes: No results for input(s): CKTOTAL, CKMB, CKMBINDEX, TROPONINI in the last 168 hours.  HbA1C: No results found for: HGBA1C  CBG: Recent Labs  Lab 08/06/24 2019 08/06/24 2347 08/07/24 0406 08/07/24 0740 08/07/24 1208  GLUCAP 105* 127* 128* 107* 108*     Critical care time: n/a    Dorn Chill, MD [facility] Office: [phone]   See Amion for personal pager PCCM on call pager [phone] until 7pm. Please call Elink 7p-7a. [phone]          "

## 2024-08-07 NOTE — Telephone Encounter (Signed)
 Please schedule hospital follow up visit with me in 4 weeks for pneumonia.  CT Chest scan ordered to be done prior to follow up visit.  Thanks, JD

## 2024-08-07 NOTE — Progress Notes (Signed)
 " PROGRESS NOTE    Stephen Nguyen  FMW:[id number] DOB: Feb 01, 1976 DOA: 08/05/2024 PCP: [name], MD    Brief Narrative:  48 y.o. 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 Patient reports he has been advancing his diet at home and has been eating a soft diet for about a week.  On the day of admission started having coughing spells.  Upon admission CTA was negative for PE but showed right lower lobe cavitary lesion and some concerns of aspiration along with necrotic metastatic mass.  Assessment & Plan: Acute respiratory failure with hypoxia Pneumonia, concerns of aspiration -Continue supplemental oxygen, as needed as needed bronchodilators.   -Continue azithromycin ,  Unasyn .   -CTA negative for PE but shows multiple nodules and ground glass opacity concerning for necrotic metastatic disease and lung lesion.  Admitting provider has consulted pulmonary for their input as well -plan to treat for aspiration PNA with Augmentin  for 1 month.  -Close follow up with Pulmonologist    Back pain -Could be related to underlying pulmonary pathology but certainly cannot rule out any spinal mets.  --Depending on his symptom progression, we can consider inpatient further spinal imaging.   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   --Currently pending radiation and chemotherapy   Oropharyngeal dysphagia s/p GJ tube -Status post GJ tube placement during oropharyngeal surgery.  He has slowly  been advancing his diet at home unfortunately with might have led to this aspiration event.  Speech and swallow team has been consulted  speech recommend regular diet, discussed with pulmonologist ok with some oral intake, patient may limit when he start radiation.   HTN -Improved closely monitor.  IV as needed   DVT prophylaxis: Lovenox     Code Status: Full Code Family Communication:   Continue hospital stay for at least 24-48 hours Pending speech and swallow and pulmonary evaluation  PT Follow up Recs:   Subjective: He did ok with breakfast ate omelet, this am.  Discussed ok for oral intake, small amount and aspiration precautions.     Examination:  General exam: No acute distress Respiratory system: Clear to auscultation Cardiovascular system: S1, S2 regular rhythm and rate Gastrointestinal system: Bowel sounds present, soft nontender nondistended Central nervous system: Alert conversant Extremities: No edema     Diet Orders (From admission, onward)     Start     Ordered   08/06/24 1528  DIET DYS 3 Room service appropriate? Yes; Fluid consistency: Thin  Diet effective now       Question Answer Comment  Room service appropriate? Yes   Fluid consistency: Thin      08/06/24 1527            Objective: Vitals:   08/06/24 2203 08/07/24 0407 08/07/24 0500 08/07/24 0753  BP:  119/88    Pulse:  75    Resp:  16    Temp:  98.2 F (36.8 C)    TempSrc:  Oral    SpO2: 99% 99%  95%  Weight:   90.1 kg   Height:  Intake/Output Summary (Last 24 hours) at 08/07/2024 0846 Last data filed at 08/07/2024 0803 Gross per 24 hour  Intake 5101.47 ml  Output 600 ml  Net 4501.47 ml   Filed Weights   08/05/24 1649 08/07/24 0500  Weight: 90.3 kg 90.1 kg    Scheduled Meds:  enoxaparin  (LOVENOX ) injection  40 mg Subcutaneous Q24H   feeding supplement (PROSource TF20)  60 mL Per Tube BID   free water   120 mL Per Tube Q4H   ipratropium-albuterol   3 mL Nebulization  BID   ketorolac   15 mg Intravenous Q8H   mouth rinse  15 mL Mouth Rinse 4 times per day   pantoprazole   40 mg Oral Q breakfast   Continuous Infusions:  ampicillin -sulbactam (UNASYN ) IV 3 g (08/07/24 0330)   azithromycin  250 mL/hr at 08/06/24 1938   feeding supplement (OSMOLITE 1.5 CAL) 60 mL/hr at 08/07/24 0745    Nutritional status Signs/Symptoms: estimated needs Interventions: Tube feeding, Prostat Body mass index is 28.91 kg/m.  Data Reviewed:   CBC: Recent Labs  Lab 08/05/24 1701 08/06/24 0335  WBC 16.1* 12.6*  HGB 15.0 12.3*  HCT 44.2 36.7*  MCV 91.9 93.1  PLT 358 221   Basic Metabolic Panel: Recent Labs  Lab 08/05/24 1701 08/05/24 1837 08/06/24 0335 08/07/24 0355  NA 140 140 141 139  K 3.9 4.3 3.7 3.6  CL 99 102 104 104  CO2 28 28 25 26   GLUCOSE 109* 109* 101* 120*  BUN 15 14 13 11   CREATININE 0.91 0.89 0.91 0.79  CALCIUM 9.9 9.2 9.1 8.8*  MG  --   --   --  2.1  PHOS  --   --   --  4.3   GFR: Estimated Creatinine Clearance: 123.8 mL/min (by C-G formula based on SCr of 0.79 mg/dL). Liver Function Tests: No results for input(s): AST, ALT, ALKPHOS, BILITOT, PROT, ALBUMIN in the last 168 hours. No results for input(s): LIPASE, AMYLASE in the last 168 hours. No results for input(s): AMMONIA in the last 168 hours. Coagulation Profile: No results for input(s): INR, PROTIME in the last 168 hours. Cardiac Enzymes: No results for input(s): CKTOTAL, CKMB, CKMBINDEX, TROPONINI in the last 168 hours. BNP (last 3 results) Recent Labs    08/05/24 1701  PROBNP <50.0   HbA1C: No results for input(s): HGBA1C in the last 72 hours. CBG: Recent Labs  Lab 08/06/24 2019 08/06/24 2347 08/07/24 0406 08/07/24 0740  GLUCAP 105* 127* 128* 107*   Lipid Profile: No results for input(s): CHOL, HDL, LDLCALC, TRIG, CHOLHDL, LDLDIRECT in the last 72 hours. Thyroid Function Tests: No results for input(s): TSH, T4TOTAL,  FREET4, T3FREE, THYROIDAB in the last 72 hours. Anemia Panel: No results for input(s): VITAMINB12, FOLATE, FERRITIN, TIBC, IRON, RETICCTPCT in the last 72 hours. Sepsis Labs: Recent Labs  Lab 08/06/24 0335  PROCALCITON <0.10    Recent Results (from the past 240 hours)  Resp panel by RT-PCR (RSV, Flu A&B, Covid) Anterior Nasal Swab     Status: None   Collection Time: 08/05/24  5:12 PM   Specimen: Anterior Nasal Swab  Result Value Ref Range Status   SARS Coronavirus 2 by RT PCR NEGATIVE NEGATIVE Final    Comment: (NOTE) SARS-CoV-2 target nucleic acids are NOT DETECTED.  The SARS-CoV-2 RNA is generally detectable in upper respiratory specimens during the acute phase of infection. The lowest concentration of SARS-CoV-2 viral copies this assay can detect is 138 copies/mL. A negative result does not preclude SARS-Cov-2 infection and  should not be used as the sole basis for treatment or other patient management decisions. A negative result may occur with  improper specimen collection/handling, submission of specimen other than nasopharyngeal swab, presence of viral mutation(s) within the areas targeted by this assay, and inadequate number of viral copies(<138 copies/mL). A negative result must be combined with clinical observations, patient history, and epidemiological information. The expected result is Negative.  Fact Sheet for Patients:  bloggercourse.com  Fact Sheet for Healthcare Providers:  seriousbroker.it  This test is no t yet approved or cleared by the [place]  FDA and  has been authorized for detection and/or diagnosis of SARS-CoV-2 by FDA under an Emergency Use Authorization (EUA). This EUA will remain  in effect (meaning this test can be used) for the duration of the COVID-19 declaration under Section 564(b)(1) of the Act, 21 U.S.C.section 360bbb-3(b)(1), unless the authorization is terminated   or revoked sooner.       Influenza A by PCR NEGATIVE NEGATIVE Final   Influenza B by PCR NEGATIVE NEGATIVE Final    Comment: (NOTE) The Xpert Xpress SARS-CoV-2/FLU/RSV plus assay is intended as an aid in the diagnosis of influenza from Nasopharyngeal swab specimens and should not be used as a sole basis for treatment. Nasal washings and aspirates are unacceptable for Xpert Xpress SARS-CoV-2/FLU/RSV testing.  Fact Sheet for Patients: bloggercourse.com  Fact Sheet for Healthcare Providers: seriousbroker.it  This test is not yet approved or cleared by the [place]  FDA and has been authorized for detection and/or diagnosis of SARS-CoV-2 by FDA under an Emergency Use Authorization (EUA). This EUA will remain in effect (meaning this test can be used) for the duration of the COVID-19 declaration under Section 564(b)(1) of the Act, 21 U.S.C. section 360bbb-3(b)(1), unless the authorization is terminated or revoked.     Resp Syncytial Virus by PCR NEGATIVE NEGATIVE Final    Comment: (NOTE) Fact Sheet for Patients: bloggercourse.com  Fact Sheet for Healthcare Providers: seriousbroker.it  This test is not yet approved or cleared by the [place]  FDA and has been authorized for detection and/or diagnosis of SARS-CoV-2 by FDA under an Emergency Use Authorization (EUA). This EUA will remain in effect (meaning this test can be used) for the duration of the COVID-19 declaration under Section 564(b)(1) of the Act, 21 U.S.C. section 360bbb-3(b)(1), unless the authorization is terminated or revoked.  Performed at [facility], [address], [place], [place] [address]   MRSA Next Gen by PCR, Nasal     Status: None   Collection Time: 08/06/24 12:15 AM   Specimen: Nasal Mucosa; Nasal Swab  Result Value Ref Range Status   MRSA by PCR Next Gen NOT DETECTED NOT  DETECTED Final    Comment: (NOTE) The GeneXpert MRSA Assay (FDA approved for NASAL specimens only), is one component of a comprehensive MRSA colonization surveillance program. It is not intended to diagnose MRSA infection nor to guide or monitor treatment for MRSA infections. Test performance is not FDA approved in patients less than 18 years old. Performed at [facility], [address], [place], [place] [address]   Respiratory (~20 pathogens) panel by PCR     Status: None   Collection Time: 08/06/24 12:15 AM   Specimen: Nasopharyngeal Swab; Respiratory  Result Value Ref Range Status   Adenovirus NOT DETECTED NOT DETECTED Final   Coronavirus 229E NOT DETECTED NOT DETECTED Final    Comment: (NOTE) The Coronavirus on the Respiratory Panel, DOES NOT test for the novel  Coronavirus (2019 nCoV)    Coronavirus HKU1 NOT DETECTED NOT DETECTED Final   Coronavirus NL63 NOT DETECTED NOT DETECTED Final   Coronavirus OC43 NOT DETECTED NOT DETECTED Final   Metapneumovirus NOT DETECTED NOT DETECTED Final   Rhinovirus / Enterovirus NOT DETECTED NOT DETECTED Final   Influenza A NOT DETECTED NOT DETECTED Final   Influenza B NOT DETECTED NOT DETECTED Final   Parainfluenza Virus 1 NOT DETECTED NOT DETECTED Final   Parainfluenza Virus 2 NOT DETECTED NOT DETECTED Final   Parainfluenza Virus 3 NOT DETECTED NOT DETECTED Final   Parainfluenza Virus 4 NOT DETECTED NOT DETECTED Final   Respiratory Syncytial Virus NOT DETECTED NOT DETECTED Final   Bordetella pertussis NOT DETECTED NOT DETECTED Final   Bordetella Parapertussis NOT DETECTED NOT DETECTED Final   Chlamydophila pneumoniae NOT DETECTED NOT DETECTED Final   Mycoplasma pneumoniae NOT DETECTED NOT DETECTED Final    Comment: Performed at [facility], [address] [place], [place] [address]  Expectorated Sputum Assessment w Gram Stain, Rflx to Resp Cult     Status: None   Collection Time: 08/06/24 12:25 AM   Specimen:  Expectorated Sputum  Result Value Ref Range Status   Specimen Description EXPECTORATED SPUTUM  Final   Special Requests NONE  Final   Sputum evaluation   Final    Sputum specimen not acceptable for testing.  Please recollect.   DELMA GRADE RN  @ 08/06/24 0352 08/06/24 CAL Performed at [facility], [address] [place], [place] [address]    Report Status 08/06/2024 FINAL  Final         Radiology Studies: CT Angio Chest PE W and/or Wo Contrast Result Date: 08/05/2024 EXAM: CTA CHEST 08/05/2024 05:45:16 PM TECHNIQUE: CTA of the chest was performed without and with the administration of 75 mL of iohexol  (OMNIPAQUE ) 350 MG/ML injection. Multiplanar reformatted images are provided for review. MIP images are provided for review. Automated exposure control, iterative reconstruction, and/or weight based adjustment of the mA/kV was utilized to reduce the radiation dose to as low as reasonably achievable. COMPARISON: None available. CLINICAL HISTORY: Pulmonary embolism (PE) suspected, high prob. FINDINGS: PULMONARY ARTERIES: Pulmonary arteries are adequately opacified for evaluation. No acute pulmonary embolus. Main pulmonary artery is normal in caliber. MEDIASTINUM: The heart and pericardium demonstrate no acute abnormality. There is no acute abnormality of the thoracic aorta. LYMPH NODES: No mediastinal, hilar or axillary lymphadenopathy. LUNGS AND PLEURA: Cavitary nodule in the right lower lobe measures 1.3 x 1.1 cm. Left lower lobe pulmonary nodule measures 2.2 x 1.2 cm with surrounding ground glass opacity. No focal consolidation or pulmonary edema. No evidence of pleural effusion or pneumothorax. UPPER ABDOMEN: Percutaneous gastrojejunostomy tube is partially imaged. SOFT TISSUES AND BONES: No acute bone or soft tissue abnormality. IMPRESSION: 1. No evidence of pulmonary embolism. 2. Cavitary nodule in the right lower lobe measuring 1.3 x 1.1 cm. 3. Left lower lobe pulmonary nodule  measuring 2.2 x 1.2 cm with surrounding ground glass opacity. 4. Pulmonary nodules are indeterminate and may represent necrosis metastatic disease, necrotic primary lesion, or infectious etiology. Electronically signed by: Greig Pique MD 08/05/2024 07:12 PM EST RP Workstation: [id number]   DG Chest Port 1 View Result Date: 08/05/2024 EXAM: 1 VIEW XRAY OF THE CHEST 08/05/2024 05:15:00 PM COMPARISON: 07/01/2024 CLINICAL HISTORY: SOB (shortness of breath) FINDINGS: LUNGS AND PLEURA: Low lung volumes. No focal pulmonary opacity. No pleural effusion. No pneumothorax. HEART AND MEDIASTINUM: No acute abnormality of the cardiac and  mediastinal silhouettes. Interval removal of the weighted feeding tube. BONES AND SOFT TISSUES: No acute osseous abnormality. IMPRESSION: 1. Markedly low lung volumes. No lobar pneumonia or pleural effusion. A follow-up chest CT had already been ordered at the time of this dictation. Please see that report for additional findings. Electronically signed by: Rogelia Myers MD 08/05/2024 06:01 PM EST RP Workstation: [id number]           LOS: 2 days   Time spent= 35 mins    Owen DELENA Lore, MD [facility]  If 7PM-7AM, please contact night-coverage  08/07/2024, 8:46 AM  "

## 2024-08-08 ENCOUNTER — Inpatient Hospital Stay (HOSPITAL_COMMUNITY)

## 2024-08-08 ENCOUNTER — Other Ambulatory Visit (HOSPITAL_COMMUNITY): Payer: Self-pay

## 2024-08-08 HISTORY — PX: IR REPLC GASTRO/COLONIC TUBE PERCUT W/FLUORO: IMG2333

## 2024-08-08 LAB — GLUCOSE, CAPILLARY
Glucose-Capillary: 113 mg/dL — ABNORMAL HIGH (ref 70–99)
Glucose-Capillary: 118 mg/dL — ABNORMAL HIGH (ref 70–99)
Glucose-Capillary: 119 mg/dL — ABNORMAL HIGH (ref 70–99)
Glucose-Capillary: 136 mg/dL — ABNORMAL HIGH (ref 70–99)

## 2024-08-08 MED ORDER — IPRATROPIUM-ALBUTEROL 0.5-2.5 (3) MG/3ML IN SOLN
3.0000 mL | Freq: Two times a day (BID) | RESPIRATORY_TRACT | Status: DC
  Administered 2024-08-08: 3 mL via RESPIRATORY_TRACT
  Filled 2024-08-08: qty 3

## 2024-08-08 MED ORDER — OSMOLITE 1.5 CAL PO LIQD
1000.0000 mL | ORAL | 0 refills | Status: AC
  Filled 2024-08-08: qty 1000, 30d supply, fill #0

## 2024-08-08 MED ORDER — AMOXICILLIN-POT CLAVULANATE 400-57 MG/5ML PO SUSR
875.0000 mg | Freq: Two times a day (BID) | ORAL | 0 refills | Status: AC
  Filled 2024-08-08: qty 300, 14d supply, fill #0
  Filled 2024-08-16: qty 300, 14d supply, fill #1
  Filled 2024-08-19: qty 200, 9d supply, fill #1

## 2024-08-08 MED ORDER — LIDOCAINE VISCOUS HCL 2 % MT SOLN
15.0000 mL | Freq: Once | OROMUCOSAL | Status: AC
  Administered 2024-08-08: 5 mL via OROMUCOSAL

## 2024-08-08 MED ORDER — GUAIFENESIN 100 MG/5ML PO LIQD
5.0000 mL | ORAL | 0 refills | Status: AC | PRN
  Filled 2024-08-08: qty 120, 4d supply, fill #0

## 2024-08-08 MED ORDER — IOHEXOL 300 MG/ML  SOLN
50.0000 mL | Freq: Once | INTRAMUSCULAR | Status: AC | PRN
  Administered 2024-08-08: 20 mL

## 2024-08-08 MED ORDER — FREE WATER
120.0000 mL | 0 refills | Status: AC
  Filled 2024-08-08: qty 21600, 30d supply, fill #0

## 2024-08-08 MED ORDER — LIDOCAINE HCL URETHRAL/MUCOSAL 2 % EX GEL
1.0000 | Freq: Once | CUTANEOUS | Status: DC
  Filled 2024-08-08: qty 5

## 2024-08-08 MED ORDER — LIDOCAINE VISCOUS HCL 2 % MT SOLN
OROMUCOSAL | Status: AC
  Filled 2024-08-08: qty 15

## 2024-08-08 MED ORDER — PROSOURCE TF20 ENFIT COMPATIBL EN LIQD
60.0000 mL | Freq: Two times a day (BID) | ENTERAL | 0 refills | Status: AC
  Filled 2024-08-08: qty 3600, 30d supply, fill #0

## 2024-08-08 MED ORDER — OXYCODONE-ACETAMINOPHEN 5-325 MG PO TABS
1.0000 | ORAL_TABLET | Freq: Four times a day (QID) | ORAL | 0 refills | Status: AC | PRN
  Filled 2024-08-08: qty 20, 5d supply, fill #0

## 2024-08-08 MED ORDER — IPRATROPIUM-ALBUTEROL 0.5-2.5 (3) MG/3ML IN SOLN
3.0000 mL | Freq: Two times a day (BID) | RESPIRATORY_TRACT | 0 refills | Status: AC
  Filled 2024-08-08: qty 180, 30d supply, fill #0

## 2024-08-08 NOTE — Progress Notes (Signed)
 Assisting with discharge of patient, notified by primary RN that J tube was clogged. Unable to flush. MD notified and consult for IR placed. Waiting to review discharge teaching upon patient's return.

## 2024-08-08 NOTE — Progress Notes (Signed)
 Discharge instructions given to patient and wife questions asked and answered.

## 2024-08-08 NOTE — Progress Notes (Signed)
 Discharge medications delivered to patient at the bedside.

## 2024-08-08 NOTE — Progress Notes (Signed)
 SLP Note  Patient Details Name: Stephen Nguyen MRN: [id number] DOB: 21-Jul-1976  No further acute level SLP needs at this time. Recommendation is to continue with OP SLP due to current dysphagia and anticipated decline of swallow function during and after radiation treatment.   Norleen IVAR Blase, MA, CCC-SLP Speech Therapy 08/08/2024, 11:50 AM

## 2024-08-08 NOTE — Plan of Care (Signed)
  Problem: Clinical Measurements: Goal: Ability to maintain clinical measurements within normal limits will improve Outcome: Progressing   Problem: Clinical Measurements: Goal: Diagnostic test results will improve Outcome: Progressing   Problem: Clinical Measurements: Goal: Respiratory complications will improve Outcome: Progressing   Problem: Clinical Measurements: Goal: Cardiovascular complication will be avoided Outcome: Progressing   Problem: Nutrition: Goal: Adequate nutrition will be maintained Outcome: Progressing

## 2024-08-08 NOTE — Discharge Summary (Signed)
 " Physician Discharge Summary   Patient: Stephen Nguyen MRN: [id number] DOB: 03-09-1976  Admit date:     08/05/2024  Discharge date: 08/08/2024  Discharge Physician: Owen DELENA Lore   PCP: [name], MD   Recommendations at discharge:   Needs Follow up with Oncology for further care of [facility] of oral pharynx.  Needs Follow up with Pulmonologist for repeat CT chest/   Discharge Diagnoses: Principal Problem:   Acute respiratory failure with hypoxia [facility]) Active Problems:   Community acquired pneumonia   Acute bilateral thoracic back pain   Oropharyngeal dysphagia   Oropharyngeal cancer (HCC)  Resolved Problems:   * No resolved hospital problems. *  Hospital Course: 48 y.o. 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 Patient reports he has been advancing his diet at home and has been eating a soft diet for about a week.  On the day of admission started having coughing spells.  Upon admission CTA was negative for PE but showed right lower lobe cavitary lesion and some concerns of aspiration along with necrotic metastatic mass.    Assessment and Plan: No notes have been filed under this hospital service. Service: Hospitalist Acute respiratory failure with hypoxia Pneumonia, concerns of aspiration -Continue supplemental oxygen, as needed as needed bronchodilators.   -Continue azithromycin ,  Unasyn .   -CTA negative for PE but shows multiple nodules and ground glass opacity concerning for necrotic metastatic disease and lung lesion.  Admitting provider has consulted pulmonary for their input as well -Plan to treat for aspiration PNA with Augmentin  for 1 month.  -Close follow up with Pulmonologist   stable for discharge/    Back  pain -Could be related to underlying pulmonary pathology but certainly cannot rule out any spinal mets.  --Depending on his symptom progression, we can consider inpatient further spinal imaging.   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   --Currently pending radiation and chemotherapy   Oropharyngeal dysphagia s/p GJ tube -Status post GJ tube placement during oropharyngeal surgery.  He has slowly been advancing his diet at home unfortunately with might have led to this aspiration event.  Speech and swallow team has been consulted  speech recommend regular diet, discussed with pulmonologist ok with some oral intake, patient may limit when he start radiation.    HTN -Improved closely monitor.  IV as needed   J tube, not flushing per nurse. IR consulted for evaluation.       Consultants: Pulmonologist  Procedures performed: none Disposition: Home Diet recommendation:  Regular diet DISCHARGE MEDICATION: Allergies as of 08/08/2024   No Known Allergies      Medication List     TAKE these medications    acetaminophen  325 MG tablet Commonly known as: TYLENOL  Take 2 tablets (650 mg total) by mouth every 6 (six) hours as needed for mild pain or fever (or Fever >/= 101).   amoxicillin -clavulanate 400-57 MG/5ML suspension Commonly known as: AUGMENTIN  Take 10.9 mLs (875 mg total) by mouth 2 (two) times daily.   feeding supplement (OSMOLITE 1.5 CAL) Liqd Place  1,000 mLs into feeding tube continuous.   feeding supplement (PROSource TF20) liquid Place 60 mLs into feeding tube 2 (two) times daily.   free water  Soln Place 120 mLs into feeding tube every 4 (four) hours.   guaiFENesin  100 MG/5ML liquid Commonly known  as: ROBITUSSIN Take 5 mLs by mouth every 4 (four) hours as needed for cough or to loosen phlegm.   ipratropium-albuterol  0.5-2.5 (3) MG/3ML Soln Commonly known as: DUONEB Take 3 mLs by nebulization 2 (two) times daily.   omeprazole  40 MG capsule Commonly known as: PRILOSEC Take 40 mg by mouth daily.   oxyCODONE -acetaminophen  5-325 MG tablet Commonly known as: PERCOCET/ROXICET Take 1 tablet by mouth every 6 (six) hours as needed for up to 5 days for moderate pain (pain score 4-6) or severe pain (pain score 7-10).               Durable Medical Equipment  (From admission, onward)           Start     Ordered   08/08/24 0917  For home use only DME Nebulizer machine  Once       Question Answer Comment  Patient needs a nebulizer to treat with the following condition Bronchitis   Length of Need 6 Months   Additional equipment included Administration kit      08/08/24 0916            Discharge Exam: Filed Weights   08/05/24 1649 08/07/24 0500 08/08/24 0449  Weight: 90.3 kg 90.1 kg 90.2 kg   General; NAD  Condition at discharge: stable  The results of significant diagnostics from this hospitalization (including imaging, microbiology, ancillary and laboratory) are listed below for reference.   Imaging Studies: CT Angio Chest PE W and/or Wo Contrast Result Date: 08/05/2024 EXAM: CTA CHEST 08/05/2024 05:45:16 PM TECHNIQUE: CTA of the chest was performed without and with the administration of 75 mL of iohexol  (OMNIPAQUE ) 350 MG/ML injection. Multiplanar reformatted images are provided for review. MIP images are provided for review. Automated exposure control, iterative reconstruction, and/or weight based adjustment of the mA/kV was utilized to reduce the radiation dose to as low as reasonably achievable. COMPARISON: None available. CLINICAL HISTORY: Pulmonary embolism (PE) suspected, high prob. FINDINGS: PULMONARY ARTERIES: Pulmonary arteries are adequately opacified for  evaluation. No acute pulmonary embolus. Main pulmonary artery is normal in caliber. MEDIASTINUM: The heart and pericardium demonstrate no acute abnormality. There is no acute abnormality of the thoracic aorta. LYMPH NODES: No mediastinal, hilar or axillary lymphadenopathy. LUNGS AND PLEURA: Cavitary nodule in the right lower lobe measures 1.3 x 1.1 cm. Left lower lobe pulmonary nodule measures 2.2 x 1.2 cm with surrounding ground glass opacity. No focal consolidation or pulmonary edema. No evidence of pleural effusion or pneumothorax. UPPER ABDOMEN: Percutaneous gastrojejunostomy tube is partially imaged. SOFT TISSUES AND BONES: No acute bone or soft tissue abnormality. IMPRESSION: 1. No evidence of pulmonary embolism. 2. Cavitary nodule in the right lower lobe measuring 1.3 x 1.1 cm. 3. Left lower lobe pulmonary nodule measuring 2.2 x 1.2 cm with surrounding ground glass opacity. 4. Pulmonary nodules are indeterminate and may represent necrosis metastatic disease, necrotic primary lesion, or infectious etiology. Electronically signed by: Greig Pique MD 08/05/2024 07:12 PM EST RP Workstation: [id number]   DG Chest Port 1 View Result Date: 08/05/2024 EXAM: 1 VIEW XRAY OF THE CHEST 08/05/2024 05:15:00 PM COMPARISON: 07/01/2024 CLINICAL HISTORY: SOB (shortness of breath) FINDINGS: LUNGS AND PLEURA: Low lung volumes. No focal pulmonary opacity. No  pleural effusion. No pneumothorax. HEART AND MEDIASTINUM: No acute abnormality of the cardiac and mediastinal silhouettes. Interval removal of the weighted feeding tube. BONES AND SOFT TISSUES: No acute osseous abnormality. IMPRESSION: 1. Markedly low lung volumes. No lobar pneumonia or pleural effusion. A follow-up chest CT had already been ordered at the time of this dictation. Please see that report for additional findings. Electronically signed by: Rogelia Myers MD 08/05/2024 06:01 PM EST RP Workstation: [id number]    Microbiology: Results for orders placed or  performed during the hospital encounter of 08/05/24  Resp panel by RT-PCR (RSV, Flu A&B, Covid) Anterior Nasal Swab     Status: None   Collection Time: 08/05/24  5:12 PM   Specimen: Anterior Nasal Swab  Result Value Ref Range Status   SARS Coronavirus 2 by RT PCR NEGATIVE NEGATIVE Final    Comment: (NOTE) SARS-CoV-2 target nucleic acids are NOT DETECTED.  The SARS-CoV-2 RNA is generally detectable in upper respiratory specimens during the acute phase of infection. The lowest concentration of SARS-CoV-2 viral copies this assay can detect is 138 copies/mL. A negative result does not preclude SARS-Cov-2 infection and should not be used as the sole basis for treatment or other patient management decisions. A negative result may occur with  improper specimen collection/handling, submission of specimen other than nasopharyngeal swab, presence of viral mutation(s) within the areas targeted by this assay, and inadequate number of viral copies(<138 copies/mL). A negative result must be combined with clinical observations, patient history, and epidemiological information. The expected result is Negative.  Fact Sheet for Patients:  bloggercourse.com  Fact Sheet for Healthcare Providers:  seriousbroker.it  This test is no t yet approved or cleared by the [place]  FDA and  has been authorized for detection and/or diagnosis of SARS-CoV-2 by FDA under an Emergency Use Authorization (EUA). This EUA will remain  in effect (meaning this test can be used) for the duration of the COVID-19 declaration under Section 564(b)(1) of the Act, 21 U.S.C.section 360bbb-3(b)(1), unless the authorization is terminated  or revoked sooner.       Influenza A by PCR NEGATIVE NEGATIVE Final   Influenza B by PCR NEGATIVE NEGATIVE Final    Comment: (NOTE) The Xpert Xpress SARS-CoV-2/FLU/RSV plus assay is intended as an aid in the diagnosis of influenza from  Nasopharyngeal swab specimens and should not be used as a sole basis for treatment. Nasal washings and aspirates are unacceptable for Xpert Xpress SARS-CoV-2/FLU/RSV testing.  Fact Sheet for Patients: bloggercourse.com  Fact Sheet for Healthcare Providers: seriousbroker.it  This test is not yet approved or cleared by the [place]  FDA and has been authorized for detection and/or diagnosis of SARS-CoV-2 by FDA under an Emergency Use Authorization (EUA). This EUA will remain in effect (meaning this test can be used) for the duration of the COVID-19 declaration under Section 564(b)(1) of the Act, 21 U.S.C. section 360bbb-3(b)(1), unless the authorization is terminated or revoked.     Resp Syncytial Virus by PCR NEGATIVE NEGATIVE Final    Comment: (NOTE) Fact Sheet for Patients: bloggercourse.com  Fact Sheet for Healthcare Providers: seriousbroker.it  This test is not yet approved or cleared by the [place]  FDA and has been authorized for detection and/or diagnosis of SARS-CoV-2 by FDA under an Emergency Use Authorization (EUA). This EUA will remain in effect (meaning this test can be used) for the duration of the COVID-19 declaration under Section 564(b)(1) of the Act, 21 U.S.C. section 360bbb-3(b)(1), unless the authorization is terminated or revoked.  Performed at [facility], [address], [place], [place] [address]   MRSA Next Gen by PCR, Nasal     Status: None   Collection Time: 08/06/24 12:15 AM   Specimen: Nasal Mucosa; Nasal Swab  Result Value Ref Range Status   MRSA by PCR Next Gen NOT DETECTED NOT DETECTED Final    Comment: (NOTE) The GeneXpert MRSA Assay (FDA approved for NASAL specimens only), is one component of a comprehensive MRSA colonization surveillance program. It is not intended to diagnose MRSA infection nor to guide or  monitor treatment for MRSA infections. Test performance is not FDA approved in patients less than 23 years old. Performed at [facility], [address] [place], [place] [address]   Respiratory (~20 pathogens) panel by PCR     Status: None   Collection Time: 08/06/24 12:15 AM   Specimen: Nasopharyngeal Swab; Respiratory  Result Value Ref Range Status   Adenovirus NOT DETECTED NOT DETECTED Final   Coronavirus 229E NOT DETECTED NOT DETECTED Final    Comment: (NOTE) The Coronavirus on the Respiratory Panel, DOES NOT test for the novel  Coronavirus (2019 nCoV)    Coronavirus HKU1 NOT DETECTED NOT DETECTED Final   Coronavirus NL63 NOT DETECTED NOT DETECTED Final   Coronavirus OC43 NOT DETECTED NOT DETECTED Final   Metapneumovirus NOT DETECTED NOT DETECTED Final   Rhinovirus / Enterovirus NOT DETECTED NOT DETECTED Final   Influenza A NOT DETECTED NOT DETECTED Final   Influenza B NOT DETECTED NOT DETECTED Final   Parainfluenza Virus 1 NOT DETECTED NOT DETECTED Final   Parainfluenza Virus 2 NOT DETECTED NOT DETECTED Final   Parainfluenza Virus 3 NOT DETECTED NOT DETECTED Final   Parainfluenza Virus 4 NOT DETECTED NOT DETECTED Final   Respiratory Syncytial Virus NOT DETECTED NOT DETECTED Final   Bordetella pertussis NOT DETECTED NOT DETECTED Final   Bordetella Parapertussis NOT DETECTED NOT DETECTED Final   Chlamydophila pneumoniae NOT DETECTED NOT DETECTED Final   Mycoplasma pneumoniae NOT DETECTED NOT DETECTED Final    Comment: Performed at [facility], [address], [place], [place] [address]  Expectorated Sputum Assessment w Gram Stain, Rflx to Resp Cult     Status: None   Collection Time: 08/06/24 12:25 AM   Specimen: Expectorated Sputum  Result Value Ref Range Status   Specimen Description EXPECTORATED SPUTUM  Final   Special Requests NONE  Final   Sputum evaluation   Final    Sputum specimen not acceptable for testing.  Please recollect.   DELMA GRADE  RN  @ 08/06/24 0352 08/06/24 CAL Performed at [facility], [address] [place], [place] [address]    Report Status 08/06/2024 FINAL  Final    Labs: CBC: Recent Labs  Lab 08/05/24 1701 08/06/24 0335 08/07/24 0917  WBC 16.1* 12.6* 5.9  HGB 15.0 12.3* 12.5*  HCT 44.2 36.7* 36.5*  MCV 91.9 93.1 91.9  PLT 358 221 270   Basic Metabolic Panel: Recent Labs  Lab 08/05/24 1701 08/05/24 1837 08/06/24 0335 08/07/24 0355  NA 140 140 141 139  K 3.9 4.3 3.7 3.6  CL 99 102 104 104  CO2 28 28 25 26   GLUCOSE 109* 109* 101* 120*  BUN 15 14 13 11   CREATININE 0.91 0.89 0.91 0.79  CALCIUM 9.9 9.2 9.1 8.8*  MG  --   --   --  2.1  PHOS  --   --   --  4.3  Liver Function Tests: No results for input(s): AST, ALT, ALKPHOS, BILITOT, PROT, ALBUMIN in the last 168 hours. CBG: Recent Labs  Lab 08/07/24 1636 08/07/24 1951 08/08/24 0008 08/08/24 0433 08/08/24 0728  GLUCAP 119* 133* 119* 136* 118*    Discharge time spent: greater than 30 minutes.  Signed: Owen DELENA Lore, MD [facility] 08/08/2024 "

## 2024-08-08 NOTE — Telephone Encounter (Signed)
 Patient scheduled.

## 2024-08-08 NOTE — Procedures (Signed)
 Interventional Radiology Procedure:   Indications: J lumen of GJ tube is occluded.  Procedure: Exchange of GJ tube  Findings: J lumen was occluded.  Old GJ tube was cut and removed a wire.  New 18 Fr GJ tube placed, tip in jejunum.    Complications: None     EBL: Minimal  Plan:  GJ tube is ready to use.    Laneshia Pina R. Philip, MD  Pager: [phone]

## 2024-08-08 NOTE — TOC Transition Note (Signed)
 Transition of Care [facility]) - Discharge Note   Patient Details  Name: Stephen Nguyen MRN: [id number] Date of Birth: Dec 13, 1975  Transition of Care [facility]) CM/SW Contact:  Tawni CHRISTELLA Eva, LCSW Phone Number: 08/08/2024, 10:28 AM   Clinical Narrative:     Pt to d/c home. Pt rec for nebulizer machine. Orders has been faxed to [place]. No further ICM needs, ICM sign off.   Final next level of care: Home/Self Care Barriers to Discharge: Barriers Resolved   Patient Goals and CMS Choice Patient states their goals for this hospitalization and ongoing recovery are:: return home          Discharge Placement                    Patient and family notified of of transfer: 08/08/24  Discharge Plan and Services Additional resources added to the After Visit Summary for                                       Social Drivers of Health (SDOH) Interventions SDOH Screenings   Food Insecurity: No Food Insecurity (08/05/2024)  Housing: Low Risk (08/05/2024)  Transportation Needs: No Transportation Needs (08/05/2024)  Utilities: Not At Risk (08/05/2024)  Recent Concern: Utilities - Medium Risk (06/23/2024)   Received from [facility]  Depression (PHQ2-9): Low Risk (07/25/2024)  Tobacco Use: Low Risk (08/06/2024)     Readmission Risk Interventions     No data to display

## 2024-08-16 ENCOUNTER — Other Ambulatory Visit (HOSPITAL_COMMUNITY): Payer: Self-pay

## 2024-08-17 ENCOUNTER — Other Ambulatory Visit (HOSPITAL_COMMUNITY): Payer: Self-pay

## 2024-08-17 ENCOUNTER — Other Ambulatory Visit: Payer: Self-pay

## 2024-08-19 ENCOUNTER — Other Ambulatory Visit (HOSPITAL_COMMUNITY): Payer: Self-pay

## 2024-08-23 ENCOUNTER — Ambulatory Visit: Admitting: Radiation Oncology

## 2024-08-23 ENCOUNTER — Ambulatory Visit

## 2024-08-23 NOTE — Progress Notes (Signed)
 Has armband been applied?  {yes no:314532}  Does patient have an allergy to IV contrast dye?: {yes no:314532}   Has patient ever received premedication for IV contrast dye?: {yes no:314532}   Date of lab work: {Time; dates multiple:15870} BUN: 11 CR: 0.79 eGFR: >60  Does patient take metformin?: {yes no:314532}  Is eGFR >60?: {yes no:314532} If no, when can patient resume? (Must be 48 hrs AFTER they receive IV contrast):  {Time; dates multiple:15870}  IV site: {iv locations:314275}  Has IV site been added to flowsheet?  {yes no:314532}  There were no vitals taken for this visit.

## 2024-08-25 ENCOUNTER — Ambulatory Visit
Admission: RE | Admit: 2024-08-25 | Discharge: 2024-08-25 | Disposition: A | Discharge status: Home / Self Care | Source: Ambulatory Visit | Attending: Radiation Oncology | Admitting: Radiation Oncology

## 2024-08-25 VITALS — BP 153/103 | HR 86 | Temp 97.8°F | Resp 18 | Ht 70.0 in | Wt 210.4 lb

## 2024-08-25 DIAGNOSIS — C01 Malignant neoplasm of base of tongue: Secondary | ICD-10-CM | POA: Insufficient documentation

## 2024-08-25 NOTE — Addendum Note (Signed)
 Encounter addended by: Shaquaya Wuellner M, RN on: 08/25/2024 4:41 PM  Actions taken: [facility] properties accepted

## 2024-08-26 NOTE — Progress Notes (Signed)
 Oncology Nurse Navigator Documentation   To provide support, encouragement and care continuity, met with Mr. Rothe before his CT SIM.  He did tolerated procedure without difficulty, denied questions/concerns.   I encouraged him to call me prior to 09/01/24 New Start.   Delon Jefferson RN, BSN, OCN Head & Neck Oncology Nurse Navigator [facility] at [facility] Phone # [phone]  Fax # [phone]

## 2024-08-29 ENCOUNTER — Other Ambulatory Visit: Payer: Self-pay

## 2024-08-29 DIAGNOSIS — C01 Malignant neoplasm of base of tongue: Secondary | ICD-10-CM

## 2024-08-30 ENCOUNTER — Telehealth: Payer: Self-pay

## 2024-08-30 NOTE — Telephone Encounter (Signed)
 [facility] Clinical Social Work  Initial Assessment   Stephen Nguyen is a 49 y.o. year old male contacted by phone. Clinical Social Work was referred by nurse navigator for assessment of psychosocial needs.   SDOH (Social Determinants of Health) assessments performed: Yes   SDOH Screenings   Food Insecurity: No Food Insecurity (08/05/2024)  Housing: Low Risk (08/05/2024)  Transportation Needs: No Transportation Needs (08/05/2024)  Utilities: Not At Risk (08/05/2024)  Recent Concern: Utilities - Medium Risk (06/23/2024)   Received from [facility]  Depression (PHQ2-9): Low Risk (07/25/2024)  Tobacco Use: Low Risk (08/18/2024)   Received from [facility]    PHQ 2/9:    07/25/2024   12:46 PM  Depression screen PHQ 2/9  Decreased Interest 0  Down, Depressed, Hopeless 1  PHQ - 2 Score 1     Distress Screen completed: No     No data to display            Family/Social Information:  Housing Arrangement: patient lives with spouse. Family members/support persons in your life? Family and Friends Transportation concerns: no  Employment: Working full time remotely .  Income source: Employment Financial concerns: No Type of concern: None Food access concerns: no Religious or spiritual practice: Publishing Rights Manager  Advanced directives: No Services Currently in place:  Employment, Community Education Officer, Transportation  Coping/ Adjustment to diagnosis: Patient understands treatment plan and what happens next? yes Concerns about diagnosis and/or treatment: I'm not especially worried about anything Patient reported stressors: No Stressors. Patient reported being motivated to getting treatment completed.  Current coping skills/ strengths: Ability for insight , Active sense of humor , Average or above average intelligence , Capable of independent living , Communication skills , Financial means , General fund of knowledge , Motivation for treatment/growth , Physical Health , Religious  Affiliation , and Supportive family/friends     SUMMARY: Current SDOH Barriers:  No SDOH barriers  Clinical Social Work Clinical Goal(s):  No clinical social work goals at this time  Interventions: Patient previously informed of supports available.  Provided CSW contact information and encouraged patient to call with any questions or concerns   Follow Up Plan: Patient will contact CSW with any support or resource needs Patient verbalizes understanding of plan: Yes    Lizbeth Sprague, LCSW Clinical Social Worker [facility]  P

## 2024-09-01 ENCOUNTER — Other Ambulatory Visit: Payer: Self-pay | Admitting: Radiation Oncology

## 2024-09-01 ENCOUNTER — Ambulatory Visit
Admission: RE | Admit: 2024-09-01 | Discharge: 2024-09-01 | Disposition: A | Discharge status: Home / Self Care | Source: Ambulatory Visit | Attending: Radiation Oncology | Admitting: Radiation Oncology

## 2024-09-01 ENCOUNTER — Other Ambulatory Visit: Payer: Self-pay

## 2024-09-01 ENCOUNTER — Ambulatory Visit

## 2024-09-01 DIAGNOSIS — C01 Malignant neoplasm of base of tongue: Secondary | ICD-10-CM | POA: Insufficient documentation

## 2024-09-01 LAB — RAD ONC ARIA SESSION SUMMARY
Course Elapsed Days: 0
Plan Fractions Treated to Date: 1
Plan Prescribed Dose Per Fraction: 2 Gy
Plan Total Fractions Prescribed: 30
Plan Total Prescribed Dose: 60 Gy
Reference Point Dosage Given to Date: 2 Gy
Reference Point Session Dosage Given: 2 Gy
Session Number: 1

## 2024-09-01 MED ORDER — LORAZEPAM 0.5 MG PO TABS: 0.5000 mg | ORAL_TABLET | Freq: Three times a day (TID) | ORAL | 0 refills | Status: AC

## 2024-09-01 MED ORDER — LORAZEPAM 1 MG PO TABS
0.5000 mg | ORAL_TABLET | Freq: Once | ORAL | Status: AC
  Administered 2024-09-01: 0.5 mg via ORAL

## 2024-09-01 MED ORDER — LORAZEPAM 1 MG PO TABS
ORAL_TABLET | ORAL | Status: AC
  Filled 2024-09-01: qty 1

## 2024-09-01 NOTE — Progress Notes (Signed)
 Oncology Nurse Navigator Documentation   To provide support, encouragement and care continuity, met with Stephen Nguyen for his initial RT.  He was accompanied by his wife. I reviewed the 2-step treatment process, answered questions.  Stephen Nguyen completed treatment after receiving ativan  for anxiety.  I reviewed the registration/arrival procedure for subsequent treatments. I encouraged them to call me with questions/concerns as treatments proceed.   Delon Jefferson RN, BSN, OCN Head & Neck Oncology Nurse Navigator [facility] at [facility] Phone # [phone]  Fax # [phone]

## 2024-09-01 NOTE — Addendum Note (Signed)
 Encounter addended by: Mohamed-Medani, Elverna LABOR, RN on: 09/01/2024 12:23 PM  Actions taken: [facility] administration accepted

## 2024-09-02 ENCOUNTER — Ambulatory Visit
Admission: RE | Admit: 2024-09-02 | Discharge: 2024-09-02 | Disposition: A | Source: Ambulatory Visit | Attending: Radiation Oncology

## 2024-09-02 ENCOUNTER — Other Ambulatory Visit: Payer: Self-pay

## 2024-09-02 ENCOUNTER — Telehealth: Payer: Self-pay | Admitting: Dietician

## 2024-09-02 ENCOUNTER — Inpatient Hospital Stay: Attending: Radiation Oncology | Admitting: Dietician

## 2024-09-02 LAB — RAD ONC ARIA SESSION SUMMARY
Course Elapsed Days: 1
Plan Fractions Treated to Date: 2
Plan Prescribed Dose Per Fraction: 2 Gy
Plan Total Fractions Prescribed: 30
Plan Total Prescribed Dose: 60 Gy
Reference Point Dosage Given to Date: 4 Gy
Reference Point Session Dosage Given: 2 Gy
Session Number: 2

## 2024-09-02 NOTE — Telephone Encounter (Signed)
 See encounter note

## 2024-09-02 NOTE — Progress Notes (Signed)
 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 S/p left tonsil and tongue base resection 11/13 ([facility]). He is receiving adjuvant radiotherapy under the care of Dr. Izell.   12/26-12/29 - admission with PNA  Spoke with patient via telephone. He reports doing well. Patient started treatment yesterday. Says neck a little tight and saliva is slightly thick today. He is unsure if symptoms are just in his head. Will start baking soda salt water  gargles today. Patient has a great appetite. Eats 3 good meals and drinks 2 protein shakes (30g) in between meals. Patient drinks a lot of water . He is flushing tube twice daily. Patient reports being under care of RD at [facility] prior to starting treatment. PEG care and bolus feeding instructions have been provided.    Nutrition Focused Physical Exam: telephone visit - unable to complete  Medications: ativan   Labs: no new labs    Anthropometrics:   Height: 5'10 Weight: 210 lb 6.4 oz  UBW: 205 lb  BMI: 30.19   NUTRITION DIAGNOSIS: Predicted sub-optimal intake related to [facility] as evidenced by s/p PEG  for anticipated side effects of therapy likely to affect ability to tolerate po   INTERVENTION:  Continue eating 3 meals + 2 protein shakes Pt to start baking soda salt water  gargles, recommend several times daily  Continue daily PEG care and FWF   MONITORING, EVALUATION, GOAL: Pt will tolerate adequate calories and protein to minimize wt loss during treatment   Next Visit: Thursday January 29 via telephone

## 2024-09-05 ENCOUNTER — Ambulatory Visit

## 2024-09-05 ENCOUNTER — Inpatient Hospital Stay: Admitting: Pulmonary Disease

## 2024-09-06 ENCOUNTER — Other Ambulatory Visit: Payer: Self-pay

## 2024-09-06 ENCOUNTER — Ambulatory Visit: Admission: RE | Admit: 2024-09-06 | Discharge: 2024-09-06 | Attending: Radiation Oncology

## 2024-09-06 ENCOUNTER — Other Ambulatory Visit: Payer: Self-pay | Admitting: Radiation Oncology

## 2024-09-06 ENCOUNTER — Ambulatory Visit
Admission: RE | Admit: 2024-09-06 | Discharge: 2024-09-06 | Disposition: A | Discharge status: Home / Self Care | Source: Ambulatory Visit | Attending: Radiation Oncology | Admitting: Radiation Oncology

## 2024-09-06 DIAGNOSIS — C01 Malignant neoplasm of base of tongue: Secondary | ICD-10-CM

## 2024-09-06 DIAGNOSIS — R918 Other nonspecific abnormal finding of lung field: Secondary | ICD-10-CM

## 2024-09-06 LAB — RAD ONC ARIA SESSION SUMMARY
Course Elapsed Days: 5
Plan Fractions Treated to Date: 3
Plan Prescribed Dose Per Fraction: 2 Gy
Plan Total Fractions Prescribed: 30
Plan Total Prescribed Dose: 60 Gy
Reference Point Dosage Given to Date: 6 Gy
Reference Point Session Dosage Given: 2 Gy
Session Number: 3

## 2024-09-06 MED ORDER — LIDOCAINE VISCOUS HCL 2 % MT SOLN: OROMUCOSAL | 3 refills | Status: AC

## 2024-09-07 ENCOUNTER — Other Ambulatory Visit: Payer: Self-pay

## 2024-09-07 ENCOUNTER — Other Ambulatory Visit

## 2024-09-07 ENCOUNTER — Ambulatory Visit
Admission: RE | Admit: 2024-09-07 | Discharge: 2024-09-07 | Disposition: A | Discharge status: Home / Self Care | Source: Ambulatory Visit | Attending: Radiation Oncology | Admitting: Radiation Oncology

## 2024-09-07 LAB — RAD ONC ARIA SESSION SUMMARY
Course Elapsed Days: 6
Plan Fractions Treated to Date: 4
Plan Prescribed Dose Per Fraction: 2 Gy
Plan Total Fractions Prescribed: 30
Plan Total Prescribed Dose: 60 Gy
Reference Point Dosage Given to Date: 8 Gy
Reference Point Session Dosage Given: 2 Gy
Session Number: 4

## 2024-09-08 ENCOUNTER — Ambulatory Visit (HOSPITAL_COMMUNITY)
Admission: RE | Admit: 2024-09-08 | Discharge: 2024-09-08 | Disposition: A | Discharge status: Home / Self Care | Source: Ambulatory Visit | Attending: Radiation Oncology | Admitting: Radiation Oncology

## 2024-09-08 ENCOUNTER — Inpatient Hospital Stay: Attending: Radiation Oncology | Admitting: Dietician

## 2024-09-08 ENCOUNTER — Ambulatory Visit
Admission: RE | Admit: 2024-09-08 | Discharge: 2024-09-08 | Disposition: A | Discharge status: Home / Self Care | Source: Ambulatory Visit | Attending: Radiation Oncology | Admitting: Radiation Oncology

## 2024-09-08 ENCOUNTER — Telehealth: Payer: Self-pay | Admitting: Dietician

## 2024-09-08 ENCOUNTER — Other Ambulatory Visit: Payer: Self-pay

## 2024-09-08 DIAGNOSIS — R918 Other nonspecific abnormal finding of lung field: Secondary | ICD-10-CM | POA: Insufficient documentation

## 2024-09-08 DIAGNOSIS — C01 Malignant neoplasm of base of tongue: Secondary | ICD-10-CM | POA: Insufficient documentation

## 2024-09-08 LAB — RAD ONC ARIA SESSION SUMMARY
Course Elapsed Days: 7
Plan Fractions Treated to Date: 5
Plan Prescribed Dose Per Fraction: 2 Gy
Plan Total Fractions Prescribed: 30
Plan Total Prescribed Dose: 60 Gy
Reference Point Dosage Given to Date: 10 Gy
Reference Point Session Dosage Given: 2 Gy
Session Number: 5

## 2024-09-08 NOTE — Telephone Encounter (Signed)
 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 S/p left tonsil and tongue base resection + PEG 11/13 ([facility]). He is receiving adjuvant radiotherapy under the care of Dr. Izell.    12/26-12/29 - admission with PNA  Spoke with patient via telephone for nutrition follow-up. He reports throat is feeling a little tight and altered taste, but other wise doing well. Patient continues tolerating regular diet. Eating 3 meals and drinking 2 orgain shakes. Patient drinking a lot of water . Providing daily PEG care and flushing tube 2x/day. He has not started baking soda salt water  gargles. Requesting recipe.    Medications: reviewed   Labs: no new labs   Anthropometrics: Wt 208.8 lb on 1/27 redia)   1/21 - 210.4 lb   NUTRITION DIAGNOSIS: Predicted sub-optimal intake continues   INTERVENTION:  Continue 3 meals, encourage high calorie high protein foods  Continue orgain shakes 2/day in between meals Continue daily PEG care/FWF Provided recipe for baking soda salt water  gargles, encourage using several times daily and before meals     MONITORING, EVALUATION, GOAL: wt trends, intake   NEXT VISIT: Thursday February 5 via telephone with [facility]

## 2024-09-09 ENCOUNTER — Ambulatory Visit
Admission: RE | Admit: 2024-09-09 | Discharge: 2024-09-09 | Disposition: A | Source: Ambulatory Visit | Attending: Radiation Oncology

## 2024-09-09 ENCOUNTER — Other Ambulatory Visit: Payer: Self-pay

## 2024-09-09 LAB — RAD ONC ARIA SESSION SUMMARY
Course Elapsed Days: 8
Plan Fractions Treated to Date: 6
Plan Prescribed Dose Per Fraction: 2 Gy
Plan Total Fractions Prescribed: 30
Plan Total Prescribed Dose: 60 Gy
Reference Point Dosage Given to Date: 12 Gy
Reference Point Session Dosage Given: 2 Gy
Session Number: 6

## 2024-09-12 ENCOUNTER — Ambulatory Visit

## 2024-09-13 ENCOUNTER — Ambulatory Visit
Admission: RE | Admit: 2024-09-13 | Discharge: 2024-09-13 | Disposition: A | Source: Ambulatory Visit | Attending: Radiation Oncology

## 2024-09-13 ENCOUNTER — Other Ambulatory Visit: Payer: Self-pay

## 2024-09-13 LAB — RAD ONC ARIA SESSION SUMMARY
Course Elapsed Days: 12
Plan Fractions Treated to Date: 7
Plan Prescribed Dose Per Fraction: 2 Gy
Plan Total Fractions Prescribed: 30
Plan Total Prescribed Dose: 60 Gy
Reference Point Dosage Given to Date: 14 Gy
Reference Point Session Dosage Given: 2 Gy
Session Number: 7

## 2024-09-14 ENCOUNTER — Ambulatory Visit
Admission: RE | Admit: 2024-09-14 | Discharge: 2024-09-14 | Disposition: A | Source: Ambulatory Visit | Attending: Radiation Oncology

## 2024-09-14 ENCOUNTER — Other Ambulatory Visit: Payer: Self-pay

## 2024-09-14 LAB — RAD ONC ARIA SESSION SUMMARY
Course Elapsed Days: 13
Plan Fractions Treated to Date: 8
Plan Prescribed Dose Per Fraction: 2 Gy
Plan Total Fractions Prescribed: 30
Plan Total Prescribed Dose: 60 Gy
Reference Point Dosage Given to Date: 16 Gy
Reference Point Session Dosage Given: 2 Gy
Session Number: 8

## 2024-09-15 ENCOUNTER — Ambulatory Visit

## 2024-09-15 ENCOUNTER — Other Ambulatory Visit: Payer: Self-pay

## 2024-09-15 ENCOUNTER — Encounter: Payer: Self-pay | Admitting: Physical Therapy

## 2024-09-15 ENCOUNTER — Inpatient Hospital Stay: Attending: Radiation Oncology | Admitting: Nutrition

## 2024-09-15 ENCOUNTER — Ambulatory Visit
Admission: RE | Admit: 2024-09-15 | Discharge: 2024-09-15 | Disposition: A | Source: Ambulatory Visit | Attending: Radiation Oncology

## 2024-09-15 ENCOUNTER — Ambulatory Visit: Admitting: Physical Therapy

## 2024-09-15 DIAGNOSIS — Z483 Aftercare following surgery for neoplasm: Secondary | ICD-10-CM

## 2024-09-15 DIAGNOSIS — R293 Abnormal posture: Secondary | ICD-10-CM

## 2024-09-15 DIAGNOSIS — M25511 Pain in right shoulder: Secondary | ICD-10-CM

## 2024-09-15 DIAGNOSIS — M25611 Stiffness of right shoulder, not elsewhere classified: Secondary | ICD-10-CM

## 2024-09-15 DIAGNOSIS — R1312 Dysphagia, oropharyngeal phase: Secondary | ICD-10-CM

## 2024-09-15 DIAGNOSIS — C01 Malignant neoplasm of base of tongue: Secondary | ICD-10-CM

## 2024-09-15 LAB — RAD ONC ARIA SESSION SUMMARY
Course Elapsed Days: 14
Plan Fractions Treated to Date: 9
Plan Prescribed Dose Per Fraction: 2 Gy
Plan Total Fractions Prescribed: 30
Plan Total Prescribed Dose: 60 Gy
Reference Point Dosage Given to Date: 18 Gy
Reference Point Session Dosage Given: 2 Gy
Session Number: 9

## 2024-09-15 NOTE — Therapy (Signed)
 " OUTPATIENT PHYSICAL THERAPY HEAD AND NECK BASELINE EVALUATION   Patient Name: Stephen Nguyen MRN: [id number] DOB:1976/04/04, 49 y.o., male Today's Date: 09/15/2024  END OF SESSION:  PT End of Session - 09/15/24 1218     Visit Number 1    Number of Visits 9    Date for Recertification  10/13/24    PT Start Time 1135    PT Stop Time 1215    PT Time Calculation (min) 40 min    Activity Tolerance Patient tolerated treatment well    Behavior During Therapy [facility] for tasks assessed/performed          Past Medical History:  Diagnosis Date   GERD (gastroesophageal reflux disease)    Hypertension    Migraines    Past Surgical History:  Procedure Laterality Date   APPENDECTOMY     dental implant     HERNIA REPAIR     childhood   IR REPLC GASTRO/COLONIC TUBE PERCUT W/FLUORO  08/08/2024   ROTATOR CUFF REPAIR     Left   WISDOM TOOTH EXTRACTION     Patient Active Problem List   Diagnosis Date Noted   Community acquired pneumonia 08/06/2024   Acute bilateral thoracic back pain 08/06/2024   Oropharyngeal dysphagia 08/06/2024   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   Acute respiratory failure with hypoxia (HCC) 08/05/2024   Malignant neoplasm of base of tongue (HCC) 07/25/2024   Shortness of breath 05/17/2020   History of COVID-19 05/17/2020   Healthcare maintenance 05/17/2020   Benign essential HTN 11/25/2018   Leukocytosis 11/25/2018    PCP: [employer], MD  REFERRING PROVIDER: Izell Domino, MD  REFERRING DIAG: C01 (ICD-10-CM) - Malignant neoplasm of base of tongue (HCC)  THERAPY DIAG:  Abnormal posture - Plan: PT plan of care cert/re-cert  Stiffness of right shoulder, not elsewhere classified - Plan: PT plan of care cert/re-cert  Acute pain of right shoulder - Plan: PT plan of care cert/re-cert  Aftercare following surgery for neoplasm - Plan: PT plan of care cert/re-cert  Malignant neoplasm of base of tongue (HCC) - Plan: PT plan of care  cert/re-cert  Rationale for Evaluation and Treatment: Rehabilitation  ONSET DATE: 04/29/24  SUBJECTIVE:     SUBJECTIVE STATEMENT: Patient reports they are here today to be seen by their medical team for newly diagnosed cancer of left tonsil and left tongue base.    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  He presented to medical attention with a 2 months history of worsening left anterior cervical lymphadenopathy. 03/31/24 US  demonstrated a largely hypoechoic enlarged lymph node and an additional enlarged lymph node. 04/12/24 Consult with Dr. Vandegriend. Laryngoscopy performed at that time which further revealed a 1 cm exophytic mass at the left tongue base. 04/21/24 Biopsy of lymph node showing pathology with atypical cells suspicious for carcinoma.  04/29/24 Biopsy of left BOT showed HPV associated nonkeratinizing SCC. 05/20/24 PET demonstrated: the hypermetabolic left tongue base mass consistent with the known site of primary malignancy, and the hypermetabolic left level 2A lymph nodes consistent with metastatic disease. No other additional sites concerning for FDG avid metastatic disease were demonstrated otherwise. 05/27/24 CT neck showed a left palatine tonsil mass measuring at least 17 mm, and the known left level 2 nodal metastasis measuring 3.4 cm. No evidence of extranodal extension was demonstrated otherwise. He was referred to Dr. Lauralee and opted to proceed with resection of  left tonsil/tongue base/and cervical node dissections on 06/23/24. He will receive 30 fractions of radiation to his oropharynx and bilateral neck which started on 09/01/24 and complete 10/14/24. 06/23/24 resection of left tonsil/left tongue base and cervical lymph dissections. 07/20/24 GJ tube placement. Previous surgeries on L rotator cuff in 2005, and 2015 and on R rotator cuff in 2016  PATIENT GOALS:   to be educated about the signs and  symptoms of lymphedema and learn post op HEP.   PAIN:  Are you having pain? No no pain but has a sore throat and neck feels tight  PRECAUTIONS: Active CA  RED FLAGS: None   WEIGHT BEARING RESTRICTIONS: No  FALLS:  Has patient fallen in last 6 months? No Does the patient have a fear of falling that limits activity? No Is the patient reluctant to leave the house due to a fear of falling?No  LIVING ENVIRONMENT: Patient lives with: wife Lives in: House/apartment Has following equipment at home: None  OCCUPATION: full time job doing international aid/development worker in [employer]   LEISURE: lift weights, education administrator  PRIOR LEVEL OF FUNCTION: Independent   OBJECTIVE: Note: Objective measures were completed at Evaluation unless otherwise noted.  COGNITION: Overall cognitive status: Within functional limits for tasks assessed                  POSTURE:  Forward head and rounded shoulders posture  30 SEC SIT TO STAND: 27 reps in 30 sec without use of UEs which is  Excellent for patient's age  SHOULDER AROM:   Impaired  R shoulder ROM impaired post surgery, per visual estimate R shoulder 40% limited in flexion, 60% limited in abduction, 50% limited in ER, L shoulder ROM is [facility]   CERVICAL AROM:   Percent limited  Flexion WFL  Extension 50% limited  Right lateral flexion 25% limited  Left lateral flexion WFL  Right rotation WFL  Left rotation WFL    (Blank rows=not tested)   PATIENT EDUCATION:  Education details: Neck ROM, importance of posture when sitting, standing and lying down, deep breathing, walking program and importance of staying active throughout treatment, CURE article on staying active, Why exercise? flyer, lymphedema and PT info Person educated: Patient Education method: Explanation, Demonstration, Handout Education comprehension: Patient verbalized understanding and returned demonstration  HOME EXERCISE PROGRAM: Patient was instructed today in a home exercise  program today for head and neck range of motion exercises. These included active cervical flexion, active cervical extension, active cervical rotation to each direction, upper trap stretch, and shoulder retraction. Patient was encouraged to do these 2-3 times a day, holding for 5 sec each and completing for 5 reps. Pt was educated that once this becomes easier then hold the stretches for 30-60 seconds.    ASSESSMENT:  CLINICAL IMPRESSION: Pt arrives to PT with recently diagnosed L tongue cancer. He will receive 30 fractions of radiation to his oropharynx and bilateral neck which started on 09/01/24 and complete 10/14/24. Pt's cervical ROM was limited in direction of extension and R lateral flexion. He has limited R shoulder ROM especially in to abduction and flexion following neck dissection surgery. Educated pt about signs and symptoms of lymphedema as well as anatomy and physiology of lymphatic system. Educated pt in importance of staying as active as possible throughout treatment to decrease fatigue as well as head and neck ROM exercises to decrease loss of ROM. Pt would benefit from skilled PT services to improve R shoulder ROM, decrease R shoulder pain, improve  cervical ROM and decrease tightness and progress pt towards independence with a home exercise program.   Pt will benefit from skilled therapeutic intervention to improve on the following deficits: Decreased knowledge of precautions and postural dysfunction. Other deficits: decreased knowledge of condition, decreased knowledge of use of DME, decreased ROM, decreased strength, increased fascial restrictions, impaired UE functional use, postural dysfunction, and pain  PT treatment/interventions: ADL/self-care home management, pt/family education, therapeutic exercise. Other interventions 97164- PT Re-evaluation, 97110-Therapeutic exercises, 97530- Therapeutic activity, [id number]- Neuromuscular re-education, 97535- Self Care, [id number]- Manual therapy, 97760-  Orthotic Initial, [phone]- Orthotic/Prosthetic subsequent, and Patient/Family education  REHAB POTENTIAL: Good  CLINICAL DECISION MAKING: Evolving/moderate complexity  EVALUATION COMPLEXITY: Moderate   GOALS: Goals reviewed with patient? YES  LONG TERM GOALS: (STG=LTG)   Name Target Date  Goal status  1 Patient will be able to verbalize understanding of a home exercise program for cervical range of motion, posture, and walking.   Baseline:  No knowledge 09/15/2024 Achieved at eval  2 Patient will be able to verbalize understanding of proper sitting and standing posture. Baseline:  No knowledge 09/15/2024 Achieved at eval  3 Patient will be able to verbalize understanding of lymphedema risk and availability of treatment for this condition Baseline:  No knowledge 09/15/2024 Achieved at eval  4 Pt will demonstrate a return to full cervical ROM and function post operatively compared to baselines and not demonstrate any signs or symptoms of lymphedema.  Baseline: See objective measurements taken today. 10/13/24 New  5 Pt will demonstrate 150 degrees of L shoulder flexion to allow him to reach overhead.  10/13/24  NEW  6 Pt will demonstrate 150 degrees of L shoulder abduction to allow him to reach out to the side. 10/13/24 NEW   7 Pt will be independent in a home exercise program for continued stretching and strengthening as well as posture  10/13/24 NEW    PLAN:  PT FREQUENCY/DURATION: 2x/wk for 4 wks  PLAN FOR NEXT SESSION: measure bilateral shoulder ROM, begin with PROM and side lying strengthening exercises, eventually supine scap, educate on proper glenohumeral rhythm to help aovid compensatory movements   Physical Therapy Information for During and After Head/Neck Cancer Treatment: Lymphedema is a swelling condition that you may be at risk for in your neck and/or face if you have radiation treatment to the area and/or if you have surgery that includes removing lymph nodes.  There is treatment  available for this condition and it is not life-threatening.  Contact your physician or physical therapist with concerns. An excellent resource for those seeking information on lymphedema is the National Lymphedema Network's website.  It can be accessed at www.lymphnet.org If you notice swelling in your neck or face at any time following surgery (even if it is many years from now), please contact your doctor or physical therapist to discuss this.  Lymphedema can be treated at any time but it is easier for you if it is treated early on. If you have had surgery to your neck, please check with your surgeon about how soon to start doing neck range of motion exercises.  If you are not having surgery, I encourage you to start doing neck range of motion exercises today and continue these while undergoing treatment, UNLESS you have irritation of your skin or soft tissue that is aggravated by doing them.  These exercises are intended to help you prevent loss of range of motion and/or to gain range of motion in your neck (which can be limited  by tightening effects of radiation), and NOT to aggravate these tissues if they develop sensitivities from treatment. Neck range of motion exercises should be done to the point of feeling a GENTLE, TOLERABLE stretch only.  You are encouraged to start a walking or other exercise program tomorrow and continue this as much as you are able through and after treatment.  Please feel free to call me with any questions. Florina Lanis Carbon, PT, CLT Physical Therapist and Certified Lymphedema Therapist [facility] [address], [place], [place] [address] [phone] [email or web address]  WALKING  Walking is a great form of exercise to increase your strength, endurance and overall fitness.  A walking program can help you start slowly and gradually build endurance as you go.  Everyone's ability is different, so each person's starting  point will be different.  You do not have to follow them exactly.  The are just samples. You should simply find out what's right for you and stick to that program.   In the beginning, you'll start off walking 2-3 times a day for short distances.  As you get stronger, you'll be walking further at just 1-2 times per day.  A. You Can Walk For A Certain Length Of Time Each Day    Walk 5 minutes 3 times per day.  Increase 2 minutes every 2 days (3 times per day).  Work up to 25-30 minutes (1-2 times per day).   Example:   Day 1-2 5 minutes 3 times per day   Day 7-8 12 minutes 2-3 times per day   Day 13-14 25 minutes 1-2 times per day  B. You Can Walk For a Certain Distance Each Day     Distance can be substituted for time.    Example:   3 trips to mailbox (at road)   3 trips to corner of block   3 trips around the block  C. Go to local high school and use the track.    Walk for distance ____ around track  Or time ____ minutes  D. Walk ____ Jog ____ Run ___   Why exercise?  So many benefits! Here are SOME of them: Heart health, including raising your good cholesterol level and reducing heart rate and blood pressure Lung health, including improved lung capacity It burns fats, and most of us  can stand to be leaner, whether or not we are overweight. It increases the body's natural painkillers and mood elevators, so makes you feel better. Not only makes you feel better, but look better too Improves sleep Takes a bite out of stress May decrease your risk of many types of cancer If you are currently undergoing cancer treatment, exercise may improve your ability to tolerate treatments including chemotherapy. For everybody, it can improve your energy level. Those with cancer-related fatigue report a 40-50% reduction in this symptom when exercising regularly. If you are a survivor of breast, colon, or prostate cancer, it may decrease your risk of a recurrence. (This may hold for other  cancers too, but so far we have data just for these three types.)  How to exercise: Get your doctor's okay. Pick something you enjoy doing, like walking, Zumba, biking, swimming, or whatever. Start at low intensity and time, then gradually increase.  (See walking program handout.) Set a goal to achieve over time.  The American Cancer Society, American Heart Association, and U.S. Dept. of Health and Human Services recommend 150 minutes of moderate exercise, 75 minutes of vigorous exercise, or  a combination of both per week. This should be done in episodes at least 10 minutes long, spread throughout the week.  Need help being motivated? Pick something you enjoy doing, because you'll be more inclined to stick with that activity than something that feels like a chore. Do it with a friend so that you are accountable to each other. Schedule it into your day. Place it on your calendar and keep that appointment just like you do any appointment that you make. Join an exercise group that meets at a specific time.  That way, you have to show up on time, and that makes it harder to procrastinate about doing your workout.  It also keeps you accountable--people begin to expect you to be there. Join a gym where you feel comfortable and not intimidated, at the right cost. Sign up for something that you'll need to be in shape for on a specific date, like a 1K or a 5K to walk or run, a 20 or 30 mile bike ride, a mud run or something like that. If the date is looming, you know you'll need to train to be ready for it.  An added benefit is that many of these are fundraisers for good causes. If you've already paid for a gym membership, group exercise class or event, you might as well work out, so you haven't wasted your money!    Florina Sever Hercules, PT 09/15/2024, 12:27 PM                       "

## 2024-09-15 NOTE — Progress Notes (Signed)
 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  He is followed by Dr. Izell.  PEG on November 13 at [facility]  Weight: 210.4 pounds February 3 208.8 pounds January 27 210.4 pounds January 21  No new labs.  Medications reviewed  Patient is still doing well.  Reports he is forcing himself to eat due to taste alterations.  He is noticing increased fatigue.  He has been drinking 2 orgain shakes daily.  He has tried some different strategies in order to make his food taste better.  He is using baking soda and salt water  gargle before every meal.  He denies nausea, vomiting, constipation, and diarrhea.  Nutrition diagnosis:  Predicted sub-optimal energy intake continues.  Intervention: Educated on strategies for improving taste alterations.  Provided fact sheets. Encouraged baking soda and salt water  gargle before eating. Increase high-calorie high-protein ONS 3 times daily between meals.  Provided samples and coupons. Continue to provide daily PEG care and free water  flush.  Monitoring, evaluation, goals: Will tolerate adequate calories and protein to minimize weight loss.  Next visit: Thursday, February 12 after radiation therapy.  **Disclaimer: This note was dictated with voice recognition software. Similar sounding words can inadvertently be transcribed and this note may contain transcription errors which may not have been corrected upon publication of note.**

## 2024-09-15 NOTE — Therapy (Signed)
 " OUTPATIENT SPEECH LANGUAGE PATHOLOGY ONCOLOGY EVALUATION   Patient Name: Stephen Nguyen MRN: [id number] DOB:1975-10-29, 49 y.o., male Today's Date: 09/15/2024  PCP: Stephen Righter, MD REFERRING PROVIDER: Izell Domino, MD  END OF SESSION:  End of Session - 09/15/24 1320     Visit Number 1    Number of Visits 4    Date for Recertification  12/14/24    SLP Start Time 1212    SLP Stop Time  1245    SLP Time Calculation (min) 33 min    Activity Tolerance Patient tolerated treatment well          Past Medical History:  Diagnosis Date   GERD (gastroesophageal reflux disease)    Hypertension    Migraines    Past Surgical History:  Procedure Laterality Date   APPENDECTOMY     dental implant     HERNIA REPAIR     childhood   IR REPLC GASTRO/COLONIC TUBE PERCUT W/FLUORO  08/08/2024   ROTATOR CUFF REPAIR     Left   WISDOM TOOTH EXTRACTION     Patient Active Problem List   Diagnosis Date Noted   Community acquired pneumonia 08/06/2024   Acute bilateral thoracic back pain 08/06/2024   Oropharyngeal dysphagia 08/06/2024   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   Acute respiratory failure with hypoxia (HCC) 08/05/2024   Malignant neoplasm of base of tongue (HCC) 07/25/2024   Shortness of breath 05/17/2020   History of COVID-19 05/17/2020   Healthcare maintenance 05/17/2020   Benign essential HTN 11/25/2018   Leukocytosis 11/25/2018    ONSET DATE: SEE PERTINENT HISTORY BELOW; script 08/29/2024  REFERRING DIAG:  C01 (ICD-10-CM) - Malignant neoplasm of base of tongue    THERAPY DIAG:  Dysphagia, oropharyngeal phase  Rationale for Evaluation and Treatment: Rehabilitation  SUBJECTIVE:   SUBJECTIVE STATEMENT: Pt eating chicken, steak, vegetables, It just tastes bad. I could eat anything.  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 He presented to medical  attention with a 2 months history of worsening left anterior cervical lymphadenopathy. 03/31/24 US  demonstrated a largely hypoechoic enlarged lymph node and an additional enlarged lymph node. 04/12/24 Consult with Dr. Vandegriend. Laryngoscopy performed at that time which further revealed a 1 cm exophytic mass at the left tongue base. 04/21/24 Biopsy of lymph node showing pathology with atypical cells suspicious for carcinoma. 04/29/24 Biopsy of left BOT showed HPV associated nonkeratinizing SCC. 05/20/24 PET demonstrated: the hypermetabolic left tongue base mass consistent with the known site of primary malignancy, and the hypermetabolic left level 2A lymph nodes consistent with metastatic disease. No other additional sites concerning for FDG avid metastatic disease were demonstrated otherwise. 05/27/24 CT neck showed a left palatine tonsil mass measuring at least 17 mm, and the known left level 2 nodal metastasis measuring 3.4 cm. No evidence of extranodal extension was demonstrated otherwise. He was referred to Dr. Lauralee and opted to proceed with resection of left tonsil/tongue base/and cervical node dissections on 06/23/24. 07/25/24 Consult with Dr. Izell. He will receive radiation alone. Treatment plan: He will receive 30 fractions of radiation to his oropharynx and bilateral neck which started on 09/01/24 and complete 10/14/24.Pretreatment procedures: 06/23/24 resection of left tonsil/left tongue base and cervical lymph dissections. 07/20/24 GJ tube placement. Stephen Nguyen consult note on 07/25/24 documents that he is unable to eat and failed two swallow studies ([facility]) but last nutrition note (09/08/24  Stephen Nguyen) documents that he is eating well, not using GJ tube.   PAIN:  Are you having pain? No  FALLS: Has patient fallen in last 6 months?  See PT evaluation for details  LIVING ENVIRONMENT: Lives with: lives with their spouse Lives in: House/apartment  PLOF:  Level of assistance: Independent with  ADLs, Independent with IADLs Employment: Full-time employment  PATIENT GOALS: I don't want to end up like that again. (Aspirating)  OBJECTIVE:  Note: Objective measures were completed at Evaluation unless otherwise noted. DIAGNOSTIC FINDINGS: See pertinent history above  INSTRUMENTAL SWALLOW STUDY FINDINGS   08/02/2024 10:00 AM EST Speech Language Pathology Flexible Endoscopic Evaluation of Swallowing (FEES): CPT 07387 Stephen Nguyen 48 y.o. At the request of Stephen Nguyen, a Flexible Endoscopic Evaluation of Swallowing (FEES) was conducted on 08/01/2024. Impression: Patient presents with mild-moderate to moderate pharyngeal dysphagia representing improvement compared to previous eval. Residue was mild-moderate to moderate-severe and reduced with multiple swallows and, for puree/solid, a wash. Ongoing penetration with thins that cleared with cough and observed two episodes of aspiration over interarytenoid space with progression of post-cricoid residuals. No additional episodes when multiple swallows performed. Cough effective at clearing airway invasion. Patient is appropriate to continue regular solids with thin liquids and adherence to aspiration precautions below. Should continue supplementing via GJ-tube to promote stable weight. Patient was educated on risks and adverse outcomes associated with dysphagia/aspiration, impact of daily thorough oral care, and rationale for adherence to management recommendations. Provided education regarding upcoming radiation as detailed below:  - Potential effects of cancer therapies on swallowing function - Importance of proactive side effect management to promote oral intake during treatment, particularly use of baking soda/salt rinse to manage secretions (ingredients: 1 tsp baking soda, 1/2 tsp salt, 4 cups water  // Instructions: mix together, swish and spit as often as needed) - Importance of maintaining oral hydration and appropriate oral  care throughout treatment - Rationale for/importance of adhering to swallowing exercises during the course of treatment (already trained) - SLP role in swallowing management during/after treatment - Swallowing surveillance timeline after treatment  Following discussion, decision made to re-schedule MBS after treatment. Should patient pursue follow-up with SLP at [facility] then patient agreeable to reach out to cancel visit. Encouraged patient to reach out sooner if he has any questions. Patient and family member verbalized understanding of all exam results and recommendations.  Recommendations: Diet: Regular solids, Thin liquids, supplement via GJ-tube to promote stable weight during treatment Swallow strategies: sit upright slow rate attention to task perform multiple swallows before taking next sip cough/clear throat regularly Medications: via alternate means    COGNITION: Overall cognitive status: Within functional limits for tasks assessed  LANGUAGE: Receptive and Expressive language appeared WNL.  ORAL MOTOR EXAMINATION: Overall status: WFL  MOTOR SPEECH: Overall motor speech: Appears intact Respiration: diaphragmatic/abdominal breathing Phonation: normal Resonance: WFL Articulation: Appears intact Intelligibility: Intelligible  SUBJECTIVE DYSPHAGIA REPORTS:  Date of onset: None at present -  Reported symptoms: none presently  Current diet: regular and thin liquids  Co-morbid voice changes: No  FACTORS WHICH MAY INCREASE RISK OF ADVERSE EVENT IN PRESENCE OF ASPIRATION:  General health: well appearing  Risk factors: tube present (PEG)   CLINICAL SWALLOW ASSESSMENT:   Dentition: adequate natural dentition Vocal quality at baseline: normal Patient directly observed with POs: Yes: dysphagia 3 (soft) and thin liquids  Feeding: able to feed self Liquids provided by: cup Oral phase signs and symptoms: None noted today; slightly increased mastication time due to  reported  dysgeusia Pharyngeal phase signs and symptoms: None noted today                                                                                                                            TREATMENT DATE:   Research states the risk for dysphagia increases due to radiation and/or chemotherapy treatment due to a variety of factors, so SLP educated the pt about the possibility of reduced/limited ability for PO intake during rad tx. SLP also educated pt regarding possible changes to swallowing musculature after rad tx, and why adherence to dysphagia HEP provided today and PO consumption was necessary to inhibit muscle fibrosis following rad tx and to mitigate muscle disuse atrophy. SLP informed pt why this would be detrimental to their swallowing status and to their pulmonary health. Pt demonstrated understanding of these things to SLP. SLP encouraged pt to safely eat and drink as deep into their radiation/chemotherapy as possible to provide the best possible long-term swallowing outcome for pt.  SLP then developed an individualized HEP for pt involving oral and pharyngeal strengthening and ROM and pt was instructed how to perform these exercises, including SLP demonstration. After SLP demonstration, pt return demonstrated each exercise. SLP ensured pt performance was correct prior to educating pt on next exercise. Pt required demo and usual min-mod cues faded to modified independent to perform HEP. Pt was instructed to complete this program at least 5 days a week, at 20-30 reps a day (except [place] at 6 reps a day) until 6 months after his or her last day of rad tx, and then x2 a week after that, indefinitely. Among other modifications for days when pt cannot functionally swallow, SLP also suggested pt to perform only non-swallowing tasks on the handout/HEP, and if necessary to cycle through the swallowing portion so the full program of exercises can be completed instead of fatiguing on one of the swallowing  exercises and being unable to perform the other swallowing exercises. SLP instructed that swallowing exercises should then be added back into the regimen as pt is able to do so.   PATIENT EDUCATION: Education details: late effects head/neck radiation on swallow function, HEP procedure, and modification to HEP when difficulty experienced with swallowing during and after radiation course Person educated: Patient Education method: Explanation, Demonstration, Verbal cues, and Handouts Education comprehension: verbalized understanding, returned demonstration, verbal cues required, and needs further education   ASSESSMENT:  CLINICAL IMPRESSION: Patient is a 49 y.o. M who was seen today for assessment of swallowing as they undergo radiation/chemoradiation therapy. Today pt ate turkey sandwich and drank thin liquids without overt s/s oral or pharyngeal difficulty. At this time pt swallowing is deemed WNL/WFL with these POs. There are no overt s/s aspiration PNA observed by SLP nor any reported by pt at this time. Data indicate that pt's swallow ability will likely decrease over the course of radiation/chemoradiation therapy and could very well decline over time following the conclusion of that  therapy due to muscle disuse atrophy and/or muscle fibrosis. Pt will need to be seen regularly by SLP in order to assess safety of PO intake, assess the need for any objective swallow assessment, and ensuring pt is correctly completing the individualized HEP.  OBJECTIVE IMPAIRMENTS: include dysphagia. These impairments are limiting patient from safety when swallowing. Factors affecting potential to achieve goals and functional outcome are none noted today. Patient will benefit from skilled SLP services to address above impairments and improve overall function.   REHAB POTENTIAL: Good     GOALS: Goals reviewed with patient? No   SHORT TERM GOALS: Target: 3rd total session   1. Pt will complete HEP with modified  independence in 2 sessions Baseline: Goal status: INITIAL   2.  pt will tell SLP why pt is completing HEP with modified independence Baseline:  Goal status: INITIAL   3.  pt will demo understanding of at least 3 overt s/s aspiration PNA with modified independence Baseline:  Goal status: INITIAL   4.  pt will demo knowledge of how a food journal could hasten return to a more normalized diet Baseline:  Goal status: INITIAL     LONG TERM GOALS: Target: 7th total session   1.  pt will complete HEP with independence over two visits Baseline:  Goal status: INITIAL   2.  pt will describe how to modify HEP over time, and the timeline associated with reduction in HEP frequency with modified independence over two sessions Baseline:  Goal status: INITIAL     PLAN:   SLP FREQUENCY:  once approx every 4 weeks   SLP DURATION:  7 sessions   PLANNED INTERVENTIONS: Aspiration precaution training, Pharyngeal strengthening exercises, Diet toleration management , Trials of upgraded texture/liquids, SLP instruction and feedback, Compensatory strategies, and Patient/family education, [phone] (treatment of swallowing dysfunction and/or oral function for feeding)   Netasha Wehrli, CCC-SLP 09/15/2024, 1:21 PM      "

## 2024-09-16 ENCOUNTER — Other Ambulatory Visit: Payer: Self-pay

## 2024-09-16 ENCOUNTER — Ambulatory Visit: Admission: RE | Admit: 2024-09-16

## 2024-09-16 LAB — RAD ONC ARIA SESSION SUMMARY
Course Elapsed Days: 15
Plan Fractions Treated to Date: 10
Plan Prescribed Dose Per Fraction: 2 Gy
Plan Total Fractions Prescribed: 30
Plan Total Prescribed Dose: 60 Gy
Reference Point Dosage Given to Date: 20 Gy
Reference Point Session Dosage Given: 2 Gy
Session Number: 10

## 2024-09-19 ENCOUNTER — Ambulatory Visit

## 2024-09-19 ENCOUNTER — Inpatient Hospital Stay: Admitting: Pulmonary Disease

## 2024-09-20 ENCOUNTER — Ambulatory Visit

## 2024-09-21 ENCOUNTER — Ambulatory Visit

## 2024-09-21 ENCOUNTER — Ambulatory Visit: Payer: Self-pay

## 2024-09-22 ENCOUNTER — Inpatient Hospital Stay: Admitting: Nutrition

## 2024-09-22 ENCOUNTER — Ambulatory Visit

## 2024-09-23 ENCOUNTER — Ambulatory Visit

## 2024-09-26 ENCOUNTER — Ambulatory Visit

## 2024-09-27 ENCOUNTER — Ambulatory Visit

## 2024-09-27 ENCOUNTER — Ambulatory Visit: Payer: Self-pay | Admitting: Physical Therapy

## 2024-09-28 ENCOUNTER — Ambulatory Visit

## 2024-09-29 ENCOUNTER — Ambulatory Visit: Payer: Self-pay | Admitting: Rehabilitation

## 2024-09-29 ENCOUNTER — Ambulatory Visit

## 2024-09-29 ENCOUNTER — Inpatient Hospital Stay: Admitting: Nutrition

## 2024-09-30 ENCOUNTER — Ambulatory Visit

## 2024-10-03 ENCOUNTER — Ambulatory Visit

## 2024-10-04 ENCOUNTER — Ambulatory Visit: Payer: Self-pay | Admitting: Physical Therapy

## 2024-10-04 ENCOUNTER — Ambulatory Visit

## 2024-10-05 ENCOUNTER — Ambulatory Visit

## 2024-10-06 ENCOUNTER — Ambulatory Visit

## 2024-10-06 ENCOUNTER — Inpatient Hospital Stay: Admitting: Dietician

## 2024-10-06 ENCOUNTER — Ambulatory Visit: Payer: Self-pay | Admitting: Physical Therapy

## 2024-10-07 ENCOUNTER — Ambulatory Visit

## 2024-10-10 ENCOUNTER — Ambulatory Visit

## 2024-10-11 ENCOUNTER — Ambulatory Visit: Payer: Self-pay | Admitting: Physical Therapy

## 2024-10-11 ENCOUNTER — Ambulatory Visit

## 2024-10-12 ENCOUNTER — Inpatient Hospital Stay: Attending: Radiation Oncology | Admitting: Dietician

## 2024-10-12 ENCOUNTER — Ambulatory Visit

## 2024-10-13 ENCOUNTER — Ambulatory Visit

## 2024-10-13 ENCOUNTER — Ambulatory Visit: Payer: Self-pay

## 2024-10-14 ENCOUNTER — Ambulatory Visit
